# Patient Record
Sex: Male | Born: 1995
Health system: Southern US, Community
[De-identification: ages and names within clinical notes are randomized; demographics above are authoritative.]

## PROBLEM LIST (undated history)

## (undated) HISTORY — PX: OTHER SURGICAL HISTORY: SHX169

---

## 2019-10-12 ENCOUNTER — Emergency Department (HOSPITAL_COMMUNITY)
Admission: EM | Admit: 2019-10-12 | Discharge: 2019-10-12 | Disposition: A | Discharge status: Home / Self Care | Payer: Self-pay | Attending: Emergency Medicine | Admitting: Emergency Medicine

## 2019-10-12 ENCOUNTER — Encounter (HOSPITAL_COMMUNITY): Payer: Self-pay

## 2019-10-12 ENCOUNTER — Other Ambulatory Visit: Payer: Self-pay

## 2019-10-12 DIAGNOSIS — N611 Abscess of the breast and nipple: Secondary | ICD-10-CM

## 2019-10-12 MED ORDER — LIDOCAINE-EPINEPHRINE (PF) 2 %-1:200000 IJ SOLN
10.0000 mL | Freq: Once | INTRAMUSCULAR | Status: AC
  Administered 2019-10-12: 10 mL
  Filled 2019-10-12: qty 10

## 2019-10-12 MED ORDER — DOXYCYCLINE HYCLATE 100 MG PO TABS
100.0000 mg | ORAL_TABLET | Freq: Once | ORAL | Status: AC
  Administered 2019-10-12: 100 mg via ORAL
  Filled 2019-10-12: qty 1

## 2019-10-12 MED ORDER — TRAMADOL HCL 50 MG PO TABS: 50.0000 mg | ORAL_TABLET | Freq: Four times a day (QID) | ORAL | 0 refills | Status: DC | PRN

## 2019-10-12 MED ORDER — DOXYCYCLINE HYCLATE 100 MG PO CAPS: 100.0000 mg | ORAL_CAPSULE | Freq: Two times a day (BID) | ORAL | 0 refills | Status: DC

## 2019-10-12 MED ORDER — TRAMADOL HCL 50 MG PO TABS
100.0000 mg | ORAL_TABLET | Freq: Once | ORAL | Status: AC
  Administered 2019-10-12: 100 mg via ORAL
  Filled 2019-10-12: qty 2

## 2019-10-12 NOTE — Discharge Instructions (Addendum)
You were seen in the emergency department for a breast abscess.  The area was incised and drained and you will need to continue to finish your antibiotics.  Remove the packing in 2 days.  You should contact the breast center for follow-up.  Please return to the emergency department if any worsening symptoms.

## 2019-10-12 NOTE — ED Notes (Signed)
Called to General Surgery x 2.

## 2019-10-12 NOTE — ED Triage Notes (Signed)
Pt presents with c/o possible cyst on right breat. Pt reports he does have a piercing and the area of irritation is close to that.

## 2019-10-12 NOTE — ED Provider Notes (Signed)
[facility] DEPT Provider Note   CSN: [id number] Arrival date & time: 10/12/19  1521     History   Chief Complaint No chief complaint on file. breast abscess  HPI Bryan Booth is a 23 y.o. male.  He is complaining of a cyst or an abscess on right breast for the last few days.  He had a piercing of that nipple.  No fevers or chills.  Moderate pain.  No drainage.  Denies being on any hormone treatment.     The history is provided by the patient.  Abscess Location:  Torso Torso abscess location:  R breast Size:  6 Abscess quality: fluctuance, induration, painful, redness and warmth   Progression:  Worsening Pain details:    Quality:  Throbbing   Severity:  Moderate   Timing:  Constant   Progression:  Worsening Chronicity:  New Relieved by:  None tried Worsened by:  Nothing Ineffective treatments:  None tried Associated symptoms: no fever, no headaches, no nausea and no vomiting     History reviewed. No pertinent past medical history.  There are no active problems to display for this patient.   History reviewed. No pertinent surgical history.      Home Medications    Prior to Admission medications   Not on File    Family History No family history on file.  Social History Social History   Tobacco Use  . Smoking status: Never Smoker  . Smokeless tobacco: Never Used  Substance Use Topics  . Alcohol use: Never    Frequency: Never  . Drug use: Never     Allergies   Penicillins   Review of Systems Review of Systems  Constitutional: Negative for fever.  HENT: Negative for sore throat.   Eyes: Negative for visual disturbance.  Respiratory: Negative for shortness of breath.   Cardiovascular: Negative for chest pain.  Gastrointestinal: Negative for abdominal pain, nausea and vomiting.  Genitourinary: Negative for dysuria.  Musculoskeletal: Negative for neck pain.  Skin: Positive for rash and wound.   Neurological: Negative for headaches.     Physical Exam Updated Vital Signs BP (!) 186/110   Pulse 71   Temp 98.2 F (36.8 C) (Oral)   Resp 16   SpO2 100%   Physical Exam Vitals signs and nursing note reviewed.  Constitutional:      Appearance: He is well-developed.  HENT:     Head: Normocephalic and atraumatic.  Eyes:     Conjunctiva/sclera: Conjunctivae normal.  Neck:     Musculoskeletal: Neck supple.  Cardiovascular:     Rate and Rhythm: Normal rate and regular rhythm.     Pulses: Normal pulses.  Pulmonary:     Effort: Pulmonary effort is normal.  Chest:    Abdominal:     Tenderness: There is no abdominal tenderness. There is no guarding.  Skin:    General: Skin is warm and dry.  Neurological:     Mental Status: He is alert.     GCS: GCS eye subscore is 4. GCS verbal subscore is 5. GCS motor subscore is 6.        ED Treatments / Results  Labs (all labs ordered are listed, but only abnormal results are displayed) Labs Reviewed - No data to display  EKG None  Radiology No results found.  Procedures .Marland KitchenIncision and Drainage  Date/Time: 10/12/2019 9:09 PM Performed by: Hayden Rasmussen, MD Authorized by: Hayden Rasmussen, MD   Consent:    Consent  obtained:  Verbal   Consent given by:  Patient   Risks discussed:  Bleeding, incomplete drainage, pain and infection   Alternatives discussed:  No treatment, delayed treatment and referral Location:    Type:  Abscess   Size:  4   Location:  Trunk   Trunk location:  R breast Pre-procedure details:    Skin preparation:  Betadine Anesthesia (see MAR for exact dosages):    Anesthesia method:  Local infiltration   Local anesthetic:  Lidocaine 2% WITH epi Procedure type:    Complexity:  Simple Procedure details:    Incision types:  Single straight   Scalpel blade:  15   Wound management:  Probed and deloculated   Drainage:  Purulent   Drainage amount:  Moderate   Packing materials:  1/2 in  iodoform gauze   Amount 1/2" iodoform:  10 Post-procedure details:    Patient tolerance of procedure:  Tolerated well, no immediate complications   (including critical care time)  Medications Ordered in ED Medications - No data to display   Initial Impression / Assessment and Plan / ED Course  I have reviewed the triage vital signs and the nursing notes.  Pertinent labs & imaging results that were available during my care of the patient were reviewed by me and considered in my medical decision making (see chart for details).  Clinical Course as of Oct 11 2108  Sat Oct 12, 2019  3468 23 year old male here with right breast abscess.  Also has a piercing in place.  I reviewed the case with Dr. Carolynne Edouard from general surgery who recommended I&D and get if it was superficial or referring on to the breast center.  The patient is agreeable to I&D locally here.   [MB]    Clinical Course User Index [MB] Terrilee Files, MD        Final Clinical Impressions(s) / ED Diagnoses   Final diagnoses:  Breast abscess    ED Discharge Orders         Ordered    traMADol (ULTRAM) 50 MG tablet  Every 6 hours PRN     10/12/19 2118    doxycycline (VIBRAMYCIN) 100 MG capsule  2 times daily     10/12/19 2118           Terrilee Files, MD 10/13/19 [phone]

## 2019-10-15 LAB — AEROBIC CULTURE W GRAM STAIN (SUPERFICIAL SPECIMEN)

## 2019-10-16 ENCOUNTER — Telehealth: Payer: Self-pay

## 2019-10-16 NOTE — Progress Notes (Signed)
ED Antimicrobial Stewardship Positive Culture Follow Up   Bryan Booth is an 23 y.o. male who presented to [facility] on 10/12/2019 with a chief complaint of R breast abscess  Recent Results (from the past 720 hour(s))  Wound or Superficial Culture     Status: None   Collection Time: 10/12/19  9:23 PM   Specimen: Wound  Result Value Ref Range Status   Specimen Description   Final    WOUND Performed at [facility] [address] [place], [place] [address]    Special Requests   Final    RIGHT BREAST Performed at [facility] [address] [place], [place] [address]    Gram Stain   Final    ABUNDANT WBC PRESENT, PREDOMINANTLY PMN ABUNDANT GRAM POSITIVE COCCI Performed at [facility], [place] [address], [place],  [address]    Culture ABUNDANT STREPTOCOCCUS INTERMEDIUS  Final   Report Status 10/15/2019 FINAL  Final   Organism ID, Bacteria STREPTOCOCCUS INTERMEDIUS  Final      Susceptibility   Streptococcus intermedius - MIC*    PENICILLIN <=0.06 SENSITIVE Sensitive     CEFTRIAXONE 0.25 SENSITIVE Sensitive     ERYTHROMYCIN >=8 RESISTANT Resistant     LEVOFLOXACIN 0.5 SENSITIVE Sensitive     VANCOMYCIN 0.5 SENSITIVE Sensitive     * ABUNDANT STREPTOCOCCUS INTERMEDIUS    [x]  Treated with doxycycline, organism resistant to prescribed antimicrobial []  Patient discharged originally without antimicrobial agent and treatment is now indicated  Plan: ask pt if pain and swelling significantly improved If yes: dc doxycycline, no abx If no: dc doxycline, start Cefdinir 300 mg po BID x 5 days   ED Provider: Dr. Delanna Ahmadi T 10/16/2019, 8:36 AM Clinical Pharmacist [phone]

## 2019-10-16 NOTE — Telephone Encounter (Signed)
Post ED Visit - Positive Culture Follow-up: Unsuccessful Patient Follow-up  Culture assessed and recommendations reviewed by:  []  Elenor Quinones, Pharm.D. []  Heide Guile, Pharm.D., BCPS AQ-ID []  Parks Neptune, Pharm.D., BCPS []  Alycia Rossetti, Pharm.D., BCPS []  [place], [place].D., BCPS, AAHIVP []  Legrand Como, Pharm.D., BCPS, AAHIVP []  Wynell Balloon, PharmD []  Vincenza Hews, PharmD, BCPS Colin Rhein Pharm D Positive aerobic culture  For Symptom check  May need a new abx of Cefdinir 300 mg PO BID x 5 days   Dr Melina Copa []  Patient discharged without antimicrobial prescription and treatment is now indicated [x]  Organism is resistant to prescribed ED discharge antimicrobial []  Patient with positive blood cultures   Unable to contact patient after 3 attempts, letter will be sent to address on file  Genia Del 10/16/2019, 2:55 PM

## 2019-11-04 ENCOUNTER — Encounter (HOSPITAL_COMMUNITY): Payer: Self-pay

## 2019-11-04 ENCOUNTER — Other Ambulatory Visit: Payer: Self-pay

## 2019-11-04 ENCOUNTER — Emergency Department (HOSPITAL_COMMUNITY): Payer: Self-pay

## 2019-11-04 DIAGNOSIS — B20 Human immunodeficiency virus [HIV] disease: Principal | ICD-10-CM | POA: Diagnosis present

## 2019-11-04 DIAGNOSIS — A4189 Other specified sepsis: Secondary | ICD-10-CM | POA: Diagnosis present

## 2019-11-04 DIAGNOSIS — R7401 Elevation of levels of liver transaminase levels: Secondary | ICD-10-CM | POA: Diagnosis present

## 2019-11-04 DIAGNOSIS — Z20828 Contact with and (suspected) exposure to other viral communicable diseases: Secondary | ICD-10-CM | POA: Diagnosis present

## 2019-11-04 DIAGNOSIS — I32 Pericarditis in diseases classified elsewhere: Secondary | ICD-10-CM | POA: Diagnosis present

## 2019-11-04 LAB — TROPONIN I (HIGH SENSITIVITY): Troponin I (High Sensitivity): 20 ng/L — ABNORMAL HIGH (ref <18)

## 2019-11-04 LAB — CBC
HCT: 50.2 % (ref 39.0–52.0)
Hemoglobin: 16.4 g/dL (ref 13.0–17.0)
MCH: 30.9 pg (ref 26.0–34.0)
MCHC: 32.7 g/dL (ref 30.0–36.0)
MCV: 94.7 fL (ref 80.0–100.0)
Platelets: 127 10*3/uL — ABNORMAL LOW (ref 150–400)
RBC: 5.3 MIL/uL (ref 4.22–5.81)
RDW: 12.1 % (ref 11.5–15.5)
WBC: 3.6 10*3/uL — ABNORMAL LOW (ref 4.0–10.5)
nRBC: 0 % (ref 0.0–0.2)

## 2019-11-04 LAB — BASIC METABOLIC PANEL
Anion gap: 10 (ref 5–15)
BUN: 14 mg/dL (ref 6–20)
CO2: 27 mmol/L (ref 22–32)
Calcium: 8.3 mg/dL — ABNORMAL LOW (ref 8.9–10.3)
Chloride: 101 mmol/L (ref 98–111)
Creatinine, Ser: 1.14 mg/dL (ref 0.61–1.24)
GFR calc Af Amer: >60 mL/min (ref >60)
GFR calc non Af Amer: >60 mL/min (ref >60)
Glucose, Bld: 125 mg/dL — ABNORMAL HIGH (ref 70–99)
Potassium: 4 mmol/L (ref 3.5–5.1)
Sodium: 138 mmol/L (ref 135–145)

## 2019-11-04 MED ORDER — SODIUM CHLORIDE 0.9% FLUSH
3.0000 mL | Freq: Once | INTRAVENOUS | Status: AC
  Administered 2019-11-05: 04:00:00 via INTRAVENOUS

## 2019-11-04 NOTE — ED Triage Notes (Signed)
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 States he is worried about pneumonia.

## 2019-11-05 ENCOUNTER — Inpatient Hospital Stay (HOSPITAL_COMMUNITY)
Admission: EM | Admit: 2019-11-05 | Discharge: 2019-11-09 | DRG: 975 | Disposition: A | Discharge status: Home / Self Care | Payer: Self-pay | Attending: Internal Medicine | Admitting: Internal Medicine

## 2019-11-05 ENCOUNTER — Encounter (HOSPITAL_COMMUNITY): Payer: Self-pay

## 2019-11-05 ENCOUNTER — Emergency Department (HOSPITAL_COMMUNITY): Payer: Self-pay

## 2019-11-05 DIAGNOSIS — Z20822 Contact with and (suspected) exposure to covid-19: Secondary | ICD-10-CM

## 2019-11-05 DIAGNOSIS — A419 Sepsis, unspecified organism: Secondary | ICD-10-CM | POA: Diagnosis present

## 2019-11-05 DIAGNOSIS — R7401 Elevation of levels of liver transaminase levels: Secondary | ICD-10-CM | POA: Diagnosis present

## 2019-11-05 DIAGNOSIS — D696 Thrombocytopenia, unspecified: Secondary | ICD-10-CM

## 2019-11-05 DIAGNOSIS — B20 Human immunodeficiency virus [HIV] disease: Secondary | ICD-10-CM | POA: Diagnosis present

## 2019-11-05 DIAGNOSIS — R079 Chest pain, unspecified: Secondary | ICD-10-CM

## 2019-11-05 DIAGNOSIS — I309 Acute pericarditis, unspecified: Secondary | ICD-10-CM | POA: Diagnosis present

## 2019-11-05 DIAGNOSIS — Z21 Asymptomatic human immunodeficiency virus [HIV] infection status: Secondary | ICD-10-CM | POA: Diagnosis present

## 2019-11-05 DIAGNOSIS — I301 Infective pericarditis: Secondary | ICD-10-CM

## 2019-11-05 LAB — BRAIN NATRIURETIC PEPTIDE: B Natriuretic Peptide: 27.4 pg/mL (ref 0.0–100.0)

## 2019-11-05 LAB — HEPATIC FUNCTION PANEL
ALT: 394 U/L — ABNORMAL HIGH (ref 0–44)
AST: 1099 U/L — ABNORMAL HIGH (ref 15–41)
Albumin: 3.5 g/dL (ref 3.5–5.0)
Alkaline Phosphatase: 71 U/L (ref 38–126)
Bilirubin, Direct: 0.7 mg/dL — ABNORMAL HIGH (ref 0.0–0.2)
Indirect Bilirubin: 0.9 mg/dL (ref 0.3–0.9)
Total Bilirubin: 1.6 mg/dL — ABNORMAL HIGH (ref 0.3–1.2)
Total Protein: 6.4 g/dL — ABNORMAL LOW (ref 6.5–8.1)

## 2019-11-05 LAB — D-DIMER, QUANTITATIVE: D-Dimer, Quant: 9.51 ug/mL-FEU — ABNORMAL HIGH (ref 0.00–0.50)

## 2019-11-05 LAB — C-REACTIVE PROTEIN: CRP: 1.6 mg/dL — ABNORMAL HIGH (ref <1.0)

## 2019-11-05 LAB — INFLUENZA PANEL BY PCR (TYPE A & B)
Influenza A By PCR: NEGATIVE
Influenza B By PCR: NEGATIVE

## 2019-11-05 LAB — POC SARS CORONAVIRUS 2 AG -  ED: SARS Coronavirus 2 Ag: NEGATIVE

## 2019-11-05 LAB — FERRITIN: Ferritin: >7500 ng/mL — ABNORMAL HIGH (ref 24–336)

## 2019-11-05 LAB — LACTATE DEHYDROGENASE: LDH: 2249 U/L — ABNORMAL HIGH (ref 98–192)

## 2019-11-05 LAB — TROPONIN I (HIGH SENSITIVITY): Troponin I (High Sensitivity): 28 ng/L — ABNORMAL HIGH (ref <18)

## 2019-11-05 LAB — FIBRINOGEN: Fibrinogen: 248 mg/dL (ref 210–475)

## 2019-11-05 MED ORDER — ACETAMINOPHEN 650 MG RE SUPP: 650.0000 mg | Freq: Four times a day (QID) | RECTAL | Status: DC | PRN

## 2019-11-05 MED ORDER — COLCHICINE 0.6 MG PO TABS
1.2000 mg | ORAL_TABLET | Freq: Once | ORAL | Status: AC
  Administered 2019-11-05: 1.2 mg via ORAL
  Filled 2019-11-05: qty 2

## 2019-11-05 MED ORDER — KETOROLAC TROMETHAMINE 30 MG/ML IJ SOLN: 30.0000 mg | Freq: Four times a day (QID) | INTRAMUSCULAR | Status: DC | PRN

## 2019-11-05 MED ORDER — COLCHICINE 0.6 MG PO TABS
0.6000 mg | ORAL_TABLET | Freq: Two times a day (BID) | ORAL | Status: DC
  Administered 2019-11-05 – 2019-11-09 (×8): 0.6 mg via ORAL
  Filled 2019-11-05 (×8): qty 1

## 2019-11-05 MED ORDER — KETOROLAC TROMETHAMINE 30 MG/ML IJ SOLN
30.0000 mg | Freq: Once | INTRAMUSCULAR | Status: AC
  Administered 2019-11-05: 30 mg via INTRAVENOUS
  Filled 2019-11-05: qty 1

## 2019-11-05 MED ORDER — ENOXAPARIN SODIUM 40 MG/0.4ML ~~LOC~~ SOLN
40.0000 mg | SUBCUTANEOUS | Status: DC
  Administered 2019-11-05 – 2019-11-08 (×4): 40 mg via SUBCUTANEOUS
  Filled 2019-11-05 (×4): qty 0.4

## 2019-11-05 MED ORDER — IBUPROFEN 200 MG PO TABS
600.0000 mg | ORAL_TABLET | Freq: Once | ORAL | Status: AC
  Administered 2019-11-05: 600 mg via ORAL
  Filled 2019-11-05: qty 3

## 2019-11-05 MED ORDER — ONDANSETRON HCL 4 MG PO TABS: 4.0000 mg | ORAL_TABLET | Freq: Four times a day (QID) | ORAL | Status: DC | PRN

## 2019-11-05 MED ORDER — ZOLPIDEM TARTRATE 5 MG PO TABS: 5.0000 mg | ORAL_TABLET | Freq: Every evening | ORAL | Status: DC | PRN

## 2019-11-05 MED ORDER — ONDANSETRON HCL 4 MG/2ML IJ SOLN: 4.0000 mg | Freq: Four times a day (QID) | INTRAMUSCULAR | Status: DC | PRN

## 2019-11-05 MED ORDER — DOCUSATE SODIUM 100 MG PO CAPS
100.0000 mg | ORAL_CAPSULE | Freq: Two times a day (BID) | ORAL | Status: DC
  Administered 2019-11-05 – 2019-11-09 (×8): 100 mg via ORAL
  Filled 2019-11-05 (×8): qty 1

## 2019-11-05 MED ORDER — IOHEXOL 350 MG/ML SOLN
100.0000 mL | Freq: Once | INTRAVENOUS | Status: AC | PRN
  Administered 2019-11-05: 100 mL via INTRAVENOUS

## 2019-11-05 MED ORDER — BISACODYL 10 MG RE SUPP: 10.0000 mg | Freq: Every day | RECTAL | Status: DC | PRN

## 2019-11-05 MED ORDER — GUAIFENESIN ER 600 MG PO TB12
600.0000 mg | ORAL_TABLET | Freq: Two times a day (BID) | ORAL | Status: DC
  Administered 2019-11-05 – 2019-11-09 (×8): 600 mg via ORAL
  Filled 2019-11-05 (×8): qty 1

## 2019-11-05 MED ORDER — POLYETHYLENE GLYCOL 3350 17 G PO PACK: 17.0000 g | PACK | Freq: Every day | ORAL | Status: DC | PRN

## 2019-11-05 MED ORDER — SODIUM CHLORIDE (PF) 0.9 % IJ SOLN
INTRAMUSCULAR | Status: AC
  Administered 2019-11-05: 04:00:00 via INTRAVENOUS
  Filled 2019-11-05: qty 50

## 2019-11-05 MED ORDER — ACETAMINOPHEN 325 MG PO TABS
650.0000 mg | ORAL_TABLET | Freq: Once | ORAL | Status: AC
  Administered 2019-11-05: 650 mg via ORAL

## 2019-11-05 MED ORDER — TRAMADOL HCL 50 MG PO TABS: 50.0000 mg | ORAL_TABLET | Freq: Four times a day (QID) | ORAL | Status: DC | PRN

## 2019-11-05 MED ORDER — ACETAMINOPHEN 325 MG PO TABS
650.0000 mg | ORAL_TABLET | Freq: Four times a day (QID) | ORAL | Status: DC | PRN
  Administered 2019-11-06: 650 mg via ORAL
  Filled 2019-11-05: qty 2

## 2019-11-05 NOTE — ED Notes (Signed)
Mother would like a call as soon as possible with an update, Adoni Greenough, [phone] from the doctor.

## 2019-11-05 NOTE — ED Notes (Signed)
With ambulation, HR was 125 and pulse oximetry dropped to 90%. Patient denies SOB but does endorse lightheadedness. Patient assisted back to bed. SpO2 94% on RA with deep breathing after ambulation.

## 2019-11-05 NOTE — ED Notes (Signed)
2130 bad vitals validated; RR of patient is not 0, it is 18

## 2019-11-05 NOTE — ED Provider Notes (Addendum)
[facility]-EMERGENCY DEPT Provider Note   CSN: [id number] Arrival date & time: 11/04/19  2042     History   Chief Complaint Chief Complaint  Patient presents with   Chest Pain    HPI Bryan Booth is a 23 y.o. male with a hx of no major medical problems presents to the Emergency Department complaining of gradual, persistent, progressively worsening URI symptoms onset 5 days ago. Pt reports chest pain is central, nonradiating and tight feeling.   Laying down improves pain, sitting up makes pain worse. Associated symptoms include myalgias, nausea, chest pain, SOB, dizziness.  Pt denies fever, cough sore throat, loss of taste/smell.  Pt denies known sick contacts or COVID contacts.  No pleuritic pain.  Pt reports taking Nyquil and tylenol with minimal relief. Pt denies neck pain, neck stiffness, abd pain, V/D, weakness, syncope. Pt denies heart problems.  Pt reports he has mostly been laying in bed over the last few days.  He denies hx of DVT, Lupus, cancer.  Denies hormone supplements.  Pt denies leg pain/swelling.      The history is provided by the patient and medical records. No language interpreter was used.    History reviewed. No pertinent past medical history.  Patient Active Problem List   Diagnosis Date Noted   Pericarditis 11/05/2019    History reviewed. No pertinent surgical history.      Home Medications    Prior to Admission medications   Medication Sig Start Date End Date Taking? Authorizing Provider  acetaminophen (TYLENOL) 500 MG tablet Take 1,000 mg by mouth every 6 (six) hours as needed for mild pain.   Yes [provider]  doxycycline (VIBRAMYCIN) 100 MG capsule Take 1 capsule (100 mg total) by mouth 2 (two) times daily. Patient not taking: Reported on 11/05/2019 10/12/19   Terrilee FilesButler, Michael C, MD  traMADol (ULTRAM) 50 MG tablet Take 1 tablet (50 mg total) by mouth every 6 (six) hours as needed. Patient not taking:  Reported on 11/05/2019 10/12/19   Terrilee FilesButler, Michael C, MD    Family History No family history on file.  Social History Social History   Tobacco Use   Smoking status: Never Smoker   Smokeless tobacco: Never Used  Substance Use Topics   Alcohol use: Never    Frequency: Never   Drug use: Never     Allergies   Peanut-containing drug products and Penicillins   Review of Systems Review of Systems  Constitutional: Negative for appetite change, diaphoresis, fatigue, fever and unexpected weight change.  HENT: Negative for mouth sores.   Eyes: Negative for visual disturbance.  Respiratory: Positive for shortness of breath. Negative for cough, chest tightness and wheezing.   Cardiovascular: Positive for chest pain. Negative for leg swelling.  Gastrointestinal: Positive for nausea. Negative for abdominal pain, constipation, diarrhea and vomiting.  Endocrine: Negative for polydipsia, polyphagia and polyuria.  Genitourinary: Negative for dysuria, frequency, hematuria and urgency.  Musculoskeletal: Negative for back pain and neck stiffness.  Skin: Negative for rash.  Allergic/Immunologic: Negative for immunocompromised state.  Neurological: Positive for dizziness and light-headedness. Negative for syncope and headaches.  Hematological: Does not bruise/bleed easily.  Psychiatric/Behavioral: The patient is not nervous/anxious.      Physical Exam Updated Vital Signs BP 127/86    Pulse 91    Temp (!) 97.3 F (36.3 C) (Oral)    Resp 17    SpO2 98%   Physical Exam Vitals signs and nursing note reviewed.  Constitutional:  General: He is not in acute distress.    Appearance: He is not diaphoretic.  HENT:     Head: Normocephalic.  Eyes:     General: No scleral icterus.    Conjunctiva/sclera: Conjunctivae normal.  Neck:     Musculoskeletal: Normal range of motion.     Comments: No JVD Cardiovascular:     Rate and Rhythm: Normal rate and regular rhythm.     Pulses: Normal  pulses.          Radial pulses are 2+ on the right side and 2+ on the left side.     Heart sounds: No murmur.  Pulmonary:     Effort: Pulmonary effort is normal. No tachypnea, accessory muscle usage, prolonged expiration, respiratory distress or retractions.     Breath sounds: Normal breath sounds. No stridor. No decreased breath sounds, wheezing or rhonchi.     Comments: Equal chest rise. No increased work of breathing. Abdominal:     General: There is no distension.     Palpations: Abdomen is soft.     Tenderness: There is no abdominal tenderness. There is no guarding or rebound.  Musculoskeletal:     Comments: Moves all extremities equally and without difficulty.  Skin:    General: Skin is warm and dry.     Capillary Refill: Capillary refill takes less than 2 seconds.     Comments: Hot to touch  Neurological:     Mental Status: He is alert.     GCS: GCS eye subscore is 4. GCS verbal subscore is 5. GCS motor subscore is 6.     Comments: Speech is clear and goal oriented.  Psychiatric:        Mood and Affect: Mood normal.      ED Treatments / Results  Labs (all labs ordered are listed, but only abnormal results are displayed) Labs Reviewed  BASIC METABOLIC PANEL - Abnormal; Notable for the following components:      Result Value   Glucose, Bld 125 (*)    Calcium 8.3 (*)    All other components within normal limits  CBC - Abnormal; Notable for the following components:   WBC 3.6 (*)    Platelets 127 (*)    All other components within normal limits  D-DIMER, QUANTITATIVE (NOT AT [facility]) - Abnormal; Notable for the following components:   D-Dimer, Quant 9.51 (*)    All other components within normal limits  TROPONIN I (HIGH SENSITIVITY) - Abnormal; Notable for the following components:   Troponin I (High Sensitivity) 20 (*)    All other components within normal limits  TROPONIN I (HIGH SENSITIVITY) - Abnormal; Notable for the following components:   Troponin I (High  Sensitivity) 28 (*)    All other components within normal limits  NOVEL CORONAVIRUS, NAA (HOSP ORDER, SEND-OUT TO REF LAB; TAT 18-24 HRS)  INFLUENZA PANEL BY PCR (TYPE A & B)  POC SARS CORONAVIRUS 2 AG -  ED    EKG EKG Interpretation  Date/Time:  Monday November 04 2019 21:52:30 EST Ventricular Rate:  92 PR Interval:    QRS Duration: 95 QT Interval:  323 QTC Calculation: 400 R Axis:   102 Text Interpretation: Sinus rhythm Borderline right axis deviation ST elevation suggests acute pericarditis Baseline wander in lead(s) V6 No old tracing to compare Confirmed by Bryan Booth, Bryan Booth [phone]) on 11/05/2019 1:06:24 AM   Radiology Dg Chest 2 View  Result Date: 11/04/2019 CLINICAL DATA:  Chest pain and shortness of breath EXAM:  CHEST - 2 VIEW COMPARISON:  None. FINDINGS: The heart size and mediastinal contours are within normal limits. Both lungs are clear. The visualized skeletal structures are unremarkable. IMPRESSION: No active cardiopulmonary disease. Electronically Signed   By: Jasmine Pang M.D.   On: 11/04/2019 22:05   Ct Angio Chest Pe W And/or Wo Contrast  Result Date: 11/05/2019 CLINICAL DATA:  Chest pain and body aches. EXAM: CT ANGIOGRAPHY CHEST WITH CONTRAST TECHNIQUE: Multidetector CT imaging of the chest was performed using the standard protocol during bolus administration of intravenous contrast. Multiplanar CT image reconstructions and MIPs were obtained to evaluate the vascular anatomy. CONTRAST:  OMNIPAQUE IOHEXOL 350 MG/ML SOLN COMPARISON:  Chest x-ray from yesterday FINDINGS: Cardiovascular: Generous heart size likely from low volumes as there was normal appearing heart size on preceding cxr. No pericardial effusion. Accounting for intermittent motion artifact there is no evidence of pulmonary embolism. Negative aorta. Mediastinum/Nodes: No adenopathy or pneumomediastinum Lungs/Pleura: There is no edema, consolidation, effusion, or pneumothorax. Upper Abdomen: No acute  finding Musculoskeletal: Gynecomastia with nipple rings. No osseous findings. Review of the MIP images confirms the above findings. IMPRESSION: 1. Negative for pulmonary embolism or other acute finding. 2. Mild motion degradation. Electronically Signed   By: Marnee Spring M.D.   On: 11/05/2019 04:29    Procedures .Critical Care Performed by: Dierdre Forth, PA-C Authorized by: Dierdre Forth, PA-C   Critical care provider statement:    Critical care time (minutes):  45   Critical care was necessary to treat or prevent imminent or life-threatening deterioration of the following conditions:  Cardiac failure and circulatory failure   Critical care was time spent personally by me on the following activities:  Discussions with consultants, evaluation of patient's response to treatment, examination of patient, ordering and performing treatments and interventions, ordering and review of laboratory studies, ordering and review of radiographic studies, pulse oximetry, re-evaluation of patient's condition, obtaining history from patient or surrogate and review of old charts   I assumed direction of critical care for this patient from another provider in my specialty: no     (including critical care time)  Medications Ordered in ED Medications  sodium chloride flush (NS) 0.9 % injection 3 mL ( Intravenous Given by Other 11/05/19 0403)  acetaminophen (TYLENOL) tablet 650 mg (650 mg Oral Given 11/05/19 0153)  ibuprofen (ADVIL) tablet 600 mg (600 mg Oral Given 11/05/19 0433)  iohexol (OMNIPAQUE) 350 MG/ML injection 100 mL (100 mLs Intravenous Contrast Given 11/05/19 0403)  ketorolac (TORADOL) 30 MG/ML injection 30 mg (30 mg Intravenous Given 11/05/19 0526)  colchicine tablet 1.2 mg (1.2 mg Oral Given 11/05/19 0526)     Initial Impression / Assessment and Plan / ED Course  I have reviewed the triage vital signs and the nursing notes.  Pertinent labs & imaging results that were available  during my care of the patient were reviewed by me and considered in my medical decision making (see chart for details).  Clinical Course as of Nov 04 549  Tue Nov 05, 2019  9147 [phone] - mother Bryan Booth   [HM]  6578 No evidence of cardiomegaly or widened mediastinum.  No pneumonia, pulmonary edema or pneumothorax.  DG Chest 2 View [HM]  0125 Febrile.  Tylenol ordered  Temp(!): 103.3 F (39.6 C) [HM]  0333 Persistently febrile. Will give ibuprofen.  Temp(!): 102.1 F (38.9 C) [HM]  4696 Discussed with Dr. Deforest Hoyles of cardiology.  He recommends hospitalist admission for observation and pain control.     [  HM]  [phone] Patient requested that I contact his mother to give her an update.  The number he has provided for her is not in service.   [HM]  [phone] Discussed with Bryan Booth who will admit.   [HM]    Clinical Course User Index [HM] Maggie Senseney, Dahlia Client, PA-C        Kinsley Nicklaus was evaluated in Emergency Department on 11/05/2019 for the symptoms described in the history of present illness. He was evaluated in the context of the global COVID-19 pandemic, which necessitated consideration that the patient might be at risk for infection with the SARS-CoV-2 virus that causes COVID-19. Institutional protocols and algorithms that pertain to the evaluation of patients at risk for COVID-19 are in a state of rapid change based on information released by regulatory bodies including the CDC and federal and state organizations. These policies and algorithms were followed during the patient's care in the ED.   Patient presents with viral illness and worsening chest pain.  Concern for pericarditis versus early myocarditis. Patient is low risk for pulmonary embolism and pain is not pleuritic.   Will obtain D-dimer.  EKG with likely pericarditis.  Patient is young and otherwise healthy.  Doubt acute coronary syndrome as reason for his slightly elevated troponin.  02:00AM Significantly elevated  D-dimer.  Increasing troponin.  Rapid Covid is negative however I suspect this is the cause of patient's symptoms.  05:15 AM CT angio without evidence of pulmonary embolism or pericardial effusion.  Patient continues to have severe pain and fever despite treatment with acetaminophen and ibuprofen.  Discussed with cardiology who recommends admission.  Patient started on Toradol and colchicine.  5:44 AM Patient continues to be ill-appearing.  With ambulation from the bed to the door of the room his heart rate increases to 125 and his oxygen saturations dipped to 90%.    Final Clinical Impressions(s) / ED Diagnoses   Final diagnoses:  Suspected COVID-19 virus infection  Central chest pain  Acute viral pericarditis    ED Discharge Orders    None         Mardene Sayer Boyd Kerbs 11/05/19 7741    Bryan Booth, Layla Maw, DO 11/05/19 0645    Lakendria Nicastro, Dahlia Client, PA-C 11/05/19 0645    Bryan Booth, Layla Maw, DO 11/05/19 [phone]

## 2019-11-05 NOTE — ED Notes (Signed)
EDPA made aware of temperature. Patient provided oral hydration.

## 2019-11-05 NOTE — ED Notes (Signed)
Patient transported to CT 

## 2019-11-05 NOTE — H&P (Addendum)
[facility] History and Physical  Bryan Booth GLO:[id number] DOB: Jan 05, 1996 DOA: 11/05/2019  Referring physician: ED  PCP: Patient, No Pcp Per   Chief Complaint: Chest pain  HPI: Bryan Booth is a 23 y.o. male with no significant past medical history presented to hospital with complaints of chest pain.  Patient stated that he has been working in the cold and has been having chest pain, body aches and some nausea for the last 1 week.  Patient states that he has been having pain which is sharp in nature especially on coughing and taking a deep breath.  Denies any fever chills or rigor but was noted to have fever in the ED.Marland Kitchen  Denies any sputum production.  Denies syncope.  Patient denies any recent travel or sick contacts.  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  Patient denies any leg swelling tenderness.  Denies previous history of chest pain or cardiac conditions.  No history of  Vomiting, abdominal pain or diarrhea..  No urinary urgency, frequency or dysuria.  ED Course: In the ED, patient was mildly tachycardic and pulse ox dropped to 90% on ambulation.    Oxygen saturation improved to 94% on room air with deep breathing. He was febrile in the ED with a T-max of 103.3 F with mild leukopenia.  He did have mild thrombocytopenia as well..  EKG done in the ED showed some diffuse ST segment elevation in multiple leads.  Chest x-ray was unremarkable.  CT angiogram of the chest did not show any evidence of pneumonia or pulmonary embolism.  Hospitalist team was then consulted for observation in the hospital.  Review of Systems:  All systems were reviewed and were negative unless otherwise mentioned in the HPI  History reviewed. No pertinent past medical history. Past Surgical History:  Procedure Laterality Date  . non distended testis      Social History:  reports that he has never smoked. He has never used smokeless tobacco. He reports that he does not drink  alcohol or use drugs.  Allergies  Allergen Reactions  . Peanut-Containing Drug Products Anaphylaxis  . Penicillins Other (See Comments)    Did it involve swelling of the face/tongue/throat, SOB, or low BP? Unknown Did it involve sudden or severe rash/hives, skin peeling, or any reaction on the inside of your mouth or nose? Unknown Did you need to seek medical attention at a hospital or doctor's office? Unknown When did it last happen?childhood If all above answers are "NO", may proceed with cephalosporin use.      Family History  Problem Relation Age of Onset  . Mitral valve prolapse Mother   . CAD Mother   . Hypertension Mother      Prior to Admission medications   Medication Sig Start Date End Date Taking? Authorizing Provider  acetaminophen (TYLENOL) 500 MG tablet Take 1,000 mg by mouth every 6 (six) hours as needed for mild pain.   Yes [provider]  doxycycline (VIBRAMYCIN) 100 MG capsule Take 1 capsule (100 mg total) by mouth 2 (two) times daily. Patient not taking: Reported on 11/05/2019 10/12/19   Terrilee Files, MD  traMADol (ULTRAM) 50 MG tablet Take 1 tablet (50 mg total) by mouth every 6 (six) hours as needed. Patient not taking: Reported on 11/05/2019 10/12/19   Terrilee Files, MD    Physical Exam: Vitals:   11/05/19 0500 11/05/19 0528 11/05/19 0656 11/05/19 0658  BP: 121/81  119/78   Pulse: 76  71   Resp: (!) 21  20   Temp:  (!) 102.4 F (39.1 C)  98.2 F (36.8 C)  TempSrc:  Oral  Oral  SpO2: 94%  94%   Weight:      Height:       Wt Readings from Last 3 Encounters:  11/05/19 95.3 kg   Body mass index is 33.89 kg/m.  General: Obese.  Not in obvious distress.  On room air, HENT: Normocephalic, pupils equally reacting to light and accommodation.  No scleral pallor or icterus noted. Oral mucosa is moist.  Chest: Diminished breath sounds bilaterally.  Gynecomastia with nipple rings CVS: S1 &S2 heard. No murmur.  Regular rate and  rhythm. Abdomen: Soft, nontender, nondistended.  Bowel sounds are heard.  Liver is not palpable, no abdominal mass palpated Extremities: No cyanosis, clubbing or edema.  Peripheral pulses are palpable. Psych: Alert, awake and oriented, normal mood CNS:  No cranial nerve deficits.  Power equal in all extremities.   No cerebellar signs.   Skin: Warm and dry.  No rashes noted.  Tattoos noted  Labs on Admission:   CBC: Recent Labs  Lab 11/04/19 2203  WBC 3.6*  HGB 16.4  HCT 50.2  MCV 94.7  PLT 127*    Basic Metabolic Panel: Recent Labs  Lab 11/04/19 2203  NA 138  K 4.0  CL 101  CO2 27  GLUCOSE 125*  BUN 14  CREATININE 1.14  CALCIUM 8.3*    Liver Function Tests: No results for input(s): AST, ALT, ALKPHOS, BILITOT, PROT, ALBUMIN in the last 168 hours. No results for input(s): LIPASE, AMYLASE in the last 168 hours. No results for input(s): AMMONIA in the last 168 hours.  Cardiac Enzymes: No results for input(s): CKTOTAL, CKMB, CKMBINDEX, TROPONINI in the last 168 hours.  BNP (last 3 results) No results for input(s): BNP in the last 8760 hours.  ProBNP (last 3 results) No results for input(s): PROBNP in the last 8760 hours.  CBG: No results for input(s): GLUCAP in the last 168 hours.  Lipase  No results found for: LIPASE   Urinalysis No results found for: COLORURINE, APPEARANCEUR, LABSPEC, PHURINE, GLUCOSEU, HGBUR, BILIRUBINUR, KETONESUR, PROTEINUR, UROBILINOGEN, NITRITE, LEUKOCYTESUR   Drugs of Abuse  No results found for: LABOPIA, COCAINSCRNUR, Alexis, AMPHETMU, THCU, LABBARB    Radiological Exams on Admission: Dg Chest 2 View  Result Date: 11/04/2019 CLINICAL DATA:  Chest pain and shortness of breath EXAM: CHEST - 2 VIEW COMPARISON:  None. FINDINGS: The heart size and mediastinal contours are within normal limits. Both lungs are clear. The visualized skeletal structures are unremarkable. IMPRESSION: No active cardiopulmonary disease. Electronically Signed    By: Donavan Foil M.D.   On: 11/04/2019 22:05   Ct Angio Chest Pe W And/or Wo Contrast  Result Date: 11/05/2019 CLINICAL DATA:  Chest pain and body aches. EXAM: CT ANGIOGRAPHY CHEST WITH CONTRAST TECHNIQUE: Multidetector CT imaging of the chest was performed using the standard protocol during bolus administration of intravenous contrast. Multiplanar CT image reconstructions and MIPs were obtained to evaluate the vascular anatomy. CONTRAST:  112mL OMNIPAQUE IOHEXOL 350 MG/ML SOLN COMPARISON:  Chest x-ray from yesterday FINDINGS: Cardiovascular: Generous heart size likely from low volumes as there was normal appearing heart size on preceding cxr. No pericardial effusion. Accounting for intermittent motion artifact there is no evidence of pulmonary embolism. Negative aorta. Mediastinum/Nodes: No adenopathy or pneumomediastinum Lungs/Pleura: There is no edema, consolidation, effusion, or pneumothorax. Upper Abdomen: No acute finding Musculoskeletal: Gynecomastia with nipple  rings. No osseous findings. Review of the MIP images confirms the above findings. IMPRESSION: 1. Negative for pulmonary embolism or other acute finding. 2. Mild motion degradation. Electronically Signed   By: Marnee SpringJonathon  Watts M.D.   On: 11/05/2019 04:29    EKG: Personally reviewed by me which shows features of acute pericarditis with diffuse ST segment elevation, PR segment depression in lead II.  Assessment/Plan Active Problems:   Pericarditis  Myalgia, nausea fever with pleuritic chest pain.  Mild leukopenia.  Likely acute pericarditis, likely viral.  EKG showed ST elevation diffuse.  CRP and D-dimer elevated.  Influenza was negative.  Cardiology was consulted by ED who recommended Toradol and colchicine.  We will continue with that.  Received a loading dose of colchicine 1.2 mg in the ED.  We will continue with 0.5 mg twice daily.  Will closely monitor.  Point-of-care Covid is negative.  PCR pending.  Put on airborne isolation  precautions until PCR is negative.  Check 2D echocardiogram.  Check BNP.  Check HIV.  Check inflammatory biomarkers.  D-dimer was elevated.  Leukopenia, thrombocytopenia.  Will closely monitor.  Could be viral illness related.  Check LFT in a.m. check inflammatory biomarkers.   DVT Prophylaxis: Lovenox  Consultant: ED consulted with cardiology  Code Status: Full code  Microbiology COVID-19 test PCR pending  Antibiotics: None  Family Communication:  Patients' condition and plan of care including tests being ordered have been discussed with the patient and the patient's mother who indicate understanding and agree with the plan.  Disposition Plan: Home  Severity of Illness: The appropriate patient status for this patient is OBSERVATION. Observation status is judged to be reasonable and necessary in order to provide the required intensity of service to ensure the patient's safety. The patient's presenting symptoms, physical exam findings, and initial radiographic and laboratory data in the context of their medical condition is felt to place them at decreased risk for further clinical deterioration. Furthermore, it is anticipated that the patient will be medically stable for discharge from the hospital within 2 midnights of admission. The following factors support the patient status of observation.   Signed, Joycelyn DasLaxman Anaiah Mcmannis, MD [facility] 11/05/2019

## 2019-11-06 ENCOUNTER — Observation Stay (HOSPITAL_BASED_OUTPATIENT_CLINIC_OR_DEPARTMENT_OTHER): Payer: Self-pay

## 2019-11-06 DIAGNOSIS — Z20828 Contact with and (suspected) exposure to other viral communicable diseases: Secondary | ICD-10-CM

## 2019-11-06 DIAGNOSIS — R079 Chest pain, unspecified: Secondary | ICD-10-CM

## 2019-11-06 DIAGNOSIS — I309 Acute pericarditis, unspecified: Secondary | ICD-10-CM | POA: Diagnosis present

## 2019-11-06 DIAGNOSIS — B20 Human immunodeficiency virus [HIV] disease: Principal | ICD-10-CM

## 2019-11-06 DIAGNOSIS — A419 Sepsis, unspecified organism: Secondary | ICD-10-CM

## 2019-11-06 LAB — COMPREHENSIVE METABOLIC PANEL
ALT: 499 U/L — ABNORMAL HIGH (ref 0–44)
AST: 1422 U/L — ABNORMAL HIGH (ref 15–41)
Albumin: 3.6 g/dL (ref 3.5–5.0)
Alkaline Phosphatase: 106 U/L (ref 38–126)
Anion gap: 10 (ref 5–15)
BUN: 17 mg/dL (ref 6–20)
CO2: 25 mmol/L (ref 22–32)
Calcium: 8.6 mg/dL — ABNORMAL LOW (ref 8.9–10.3)
Chloride: 102 mmol/L (ref 98–111)
Creatinine, Ser: 1 mg/dL (ref 0.61–1.24)
GFR calc Af Amer: >60 mL/min (ref >60)
GFR calc non Af Amer: >60 mL/min (ref >60)
Glucose, Bld: 104 mg/dL — ABNORMAL HIGH (ref 70–99)
Potassium: 4.3 mmol/L (ref 3.5–5.1)
Sodium: 137 mmol/L (ref 135–145)
Total Bilirubin: 0.8 mg/dL (ref 0.3–1.2)
Total Protein: 6.8 g/dL (ref 6.5–8.1)

## 2019-11-06 LAB — ECHOCARDIOGRAM COMPLETE
Height: 66 in
Weight: 3365.1 oz

## 2019-11-06 LAB — CBC
HCT: 48.4 % (ref 39.0–52.0)
Hemoglobin: 15.9 g/dL (ref 13.0–17.0)
MCH: 30.5 pg (ref 26.0–34.0)
MCHC: 32.9 g/dL (ref 30.0–36.0)
MCV: 92.9 fL (ref 80.0–100.0)
Platelets: 110 10*3/uL — ABNORMAL LOW (ref 150–400)
RBC: 5.21 MIL/uL (ref 4.22–5.81)
RDW: 12.1 % (ref 11.5–15.5)
WBC: 2.6 10*3/uL — ABNORMAL LOW (ref 4.0–10.5)
nRBC: 0 % (ref 0.0–0.2)

## 2019-11-06 LAB — HEPATITIS PANEL, ACUTE
HCV Ab: NONREACTIVE
Hep A IgM: NONREACTIVE
Hep B C IgM: NONREACTIVE
Hepatitis B Surface Ag: NONREACTIVE

## 2019-11-06 LAB — PROTIME-INR
INR: 1 (ref 0.8–1.2)
Prothrombin Time: 13.5 seconds (ref 11.4–15.2)

## 2019-11-06 LAB — MAGNESIUM: Magnesium: 2.2 mg/dL (ref 1.7–2.4)

## 2019-11-06 LAB — NOVEL CORONAVIRUS, NAA (HOSP ORDER, SEND-OUT TO REF LAB; TAT 18-24 HRS): SARS-CoV-2, NAA: NOT DETECTED

## 2019-11-06 LAB — ACETAMINOPHEN LEVEL: Acetaminophen (Tylenol), Serum: <10 ug/mL — ABNORMAL LOW (ref 10–30)

## 2019-11-06 LAB — HIV ANTIBODY (ROUTINE TESTING W REFLEX): HIV Screen 4th Generation wRfx: REACTIVE — AB

## 2019-11-06 MED ORDER — IBUPROFEN 800 MG PO TABS
800.0000 mg | ORAL_TABLET | Freq: Three times a day (TID) | ORAL | Status: DC
  Administered 2019-11-06 – 2019-11-09 (×9): 800 mg via ORAL
  Filled 2019-11-06 (×9): qty 1

## 2019-11-06 NOTE — TOC Progression Note (Signed)
Transition of Care [facility]) - Progression Note    Patient Details  Name: Bryan Booth MRN: [id number] Date of Birth: 1996/01/07  [facility]) CM/SW Contact  Purcell Mouton, RN Phone Number: 11/06/2019, 10:03 AM  Clinical Narrative:    Will continue to follow for discharge needs.  Expected Discharge Plan: Home/Self Care Barriers to Discharge: No Barriers Identified  Expected Discharge Plan and Services Expected Discharge Plan: Home/Self Care   Discharge Planning Services: CM Consult   Living arrangements for the past 2 months: Single Family Home                                       Social Determinants of Health (SDOH) Interventions    Readmission Risk Interventions No flowsheet data found.

## 2019-11-06 NOTE — Progress Notes (Addendum)
PROGRESS NOTE                                                                                                                                                                                                             Patient Demographics:    Bryan ApleyJustin Vangorden, is a 23 y.o. male, DOB - 02/08/1996, WUJ:[id number]  Admit date - 11/05/2019   Admitting Physician Eduard ClosArshad N Kakrakandy, MD  Outpatient Primary MD for the patient is Patient, No Pcp Per  LOS - 0  Outpatient Specialists: None  Chief Complaint  Patient presents with  . Chest Pain       Brief Narrative 23 year old male with no past medical history presented with substernal chest pain.  Patient reports that he was working out in the cold and started having chest pain, body aches and nausea for almost a week.  Chest pain is sharp and present on coughing or taking deep breaths, no fevers or chills.  Patient however was found to be febrile in the ED with temperature of 103 F.  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 In the ED, patient was septic with fever, tachycardic and dropping O2 sat to 90% on ambulation.  EKG showed diffuse ST segment elevation in multiple leads.  Chest x-ray unremarkable.  CT angiogram of the chest negative for PE or infiltrate. Hospitalist consulted for admission to telemetry.   Subjective:   Patient reports a chest pain to be slightly better but still has some substernal pain on deep inspiration   Assessment  & Plan :   Principal problem Acute pericarditis Suspect viral etiology.  Stable on telemetry.  Cardiology consult appreciated and patient started on scheduled Motrin 810 mg 3 times daily.  Given loading dose of colchicine 1.2 mg and started on colchicine 0.6 mg twice daily.  2D echo ordered.  Cardiology consult appreciated  Sepsis (HCC) Secondary to viral  pericarditis but need to rule out COVID-19 infection.  Patient has mildly elevated CRP, elevated LDH and marked elevated ferritin but normal fibrinogen.  D-dimer is elevated as well. COVID-19 POC was negative.  Routine testing pending.  Continue airborne and droplet precautions. We will add gentle hydration.  Continue telemetry monitoring.  Positive HIV antibody. New finding.  Patient says he was  tested negative back in the summer while he was in [place].  Denies recent unprotected sex or having multiple partners, IV drug use.  Check CD4 count and viral load.  Transaminitis Markedly elevated AST>> ALT.  Normal bilirubin.  Check hepatitis panel, Tylenol level and right upper quadrant ultrasound.  Check INR.  GI consult if no improvement.  Pancytopenia Mild leukopenia and thrombocytopenia possibly due to acute viral illness.  Monitor closely  Patient status: Inpatient Patient presenting with sepsis with suspected acute pericarditis and being ruled out for COVID-19 infection.  He is requiring close monitoring on telemetry, IV hydration and management for acute pericarditis with persistent symptoms.  He is at high risk of further respiratory compromise and worsening of his sepsis and needs to be monitored closely in an inpatient setting for at least >2 midnight.   Code Status : Full code  Family Communication  : spoke with mother on phone. Patient finished college and came to the area from [place], [place] for his masters degree.   Disposition Plan  : Home once improved  Barriers For Discharge : Active symptoms  Consults  : Cardiology  Procedures  : CT angiogram chest, 2D echo  DVT Prophylaxis  :  Lovenox   Lab Results  Component Value Date   PLT 110 (L) 11/06/2019    Antibiotics  :    Anti-infectives (From admission, onward)   None        Objective:   Vitals:   11/06/19 0453 11/06/19 0614 11/06/19 0722 11/06/19 1251  BP: 131/71 104/65  137/81  Pulse: (!) 121 90  80  Resp:  18 18  16   Temp: 98.7 F (37.1 C) (!) 100.5 F (38.1 C) (!) 97.5 F (36.4 C) 98.4 F (36.9 C)  TempSrc: Oral Oral Oral Oral  SpO2: 96% 93%  94%  Weight:      Height:        Wt Readings from Last 3 Encounters:  11/06/19 95.4 kg    No intake or output data in the 24 hours ending 11/06/19 1416   Physical Exam  Gen: not in distress HEENT:  moist mucosa, supple neck Chest: clear b/l, no added sounds CVS: N S1&S2, no murmurs, rubs or gallop GI: soft, NT, ND,  Musculoskeletal: warm, no edema     Data Review:    CBC Recent Labs  Lab 11/04/19 2203 11/06/19 0505  WBC 3.6* 2.6*  HGB 16.4 15.9  HCT 50.2 48.4  PLT 127* 110*  MCV 94.7 92.9  MCH 30.9 30.5  MCHC 32.7 32.9  RDW 12.1 12.1    Chemistries  Recent Labs  Lab 11/04/19 2203 11/05/19 1932 11/06/19 0505  NA 138  --  137  K 4.0  --  4.3  CL 101  --  102  CO2 27  --  25  GLUCOSE 125*  --  104*  BUN 14  --  17  CREATININE 1.14  --  1.00  CALCIUM 8.3*  --  8.6*  MG  --   --  2.2  AST  --  1,099* 1,422*  ALT  --  394* 499*  ALKPHOS  --  71 106  BILITOT  --  1.6* 0.8   ------------------------------------------------------------------------------------------------------------------ No results for input(s): CHOL, HDL, LDLCALC, TRIG, CHOLHDL, LDLDIRECT in the last 72 hours.  No results found for: HGBA1C ------------------------------------------------------------------------------------------------------------------ No results for input(s): TSH, T4TOTAL, T3FREE, THYROIDAB in the last 72 hours.  Invalid input(s): FREET3 ------------------------------------------------------------------------------------------------------------------ Recent Labs    11/05/19  1932  FERRITIN >7,500*    Coagulation profile No results for input(s): INR, PROTIME in the last 168 hours.  Recent Labs    11/05/19 0126  DDIMER 9.51*    Cardiac Enzymes No results for input(s): CKMB, TROPONINI, MYOGLOBIN in the last 168  hours.  Invalid input(s): CK ------------------------------------------------------------------------------------------------------------------    Component Value Date/Time   BNP 27.4 11/05/2019 2143    Inpatient Medications  Scheduled Meds: . colchicine  0.6 mg Oral BID  . docusate sodium  100 mg Oral BID  . enoxaparin (LOVENOX) injection  40 mg Subcutaneous Q24H  . guaiFENesin  600 mg Oral BID  . ibuprofen  800 mg Oral TID   Continuous Infusions: PRN Meds:.acetaminophen **OR** acetaminophen, bisacodyl, ondansetron **OR** ondansetron (ZOFRAN) IV, polyethylene glycol, traMADol, zolpidem  Micro Results No results found for this or any previous visit (from the past 240 hour(s)).  Radiology Reports Dg Chest 2 View  Result Date: 11/04/2019 CLINICAL DATA:  Chest pain and shortness of breath EXAM: CHEST - 2 VIEW COMPARISON:  None. FINDINGS: The heart size and mediastinal contours are within normal limits. Both lungs are clear. The visualized skeletal structures are unremarkable. IMPRESSION: No active cardiopulmonary disease. Electronically Signed   By: Donavan Foil M.D.   On: 11/04/2019 22:05   Ct Angio Chest Pe W And/or Wo Contrast  Result Date: 11/05/2019 CLINICAL DATA:  Chest pain and body aches. EXAM: CT ANGIOGRAPHY CHEST WITH CONTRAST TECHNIQUE: Multidetector CT imaging of the chest was performed using the standard protocol during bolus administration of intravenous contrast. Multiplanar CT image reconstructions and MIPs were obtained to evaluate the vascular anatomy. CONTRAST:  191mL OMNIPAQUE IOHEXOL 350 MG/ML SOLN COMPARISON:  Chest x-ray from yesterday FINDINGS: Cardiovascular: Generous heart size likely from low volumes as there was normal appearing heart size on preceding cxr. No pericardial effusion. Accounting for intermittent motion artifact there is no evidence of pulmonary embolism. Negative aorta. Mediastinum/Nodes: No adenopathy or pneumomediastinum Lungs/Pleura: There  is no edema, consolidation, effusion, or pneumothorax. Upper Abdomen: No acute finding Musculoskeletal: Gynecomastia with nipple rings. No osseous findings. Review of the MIP images confirms the above findings. IMPRESSION: 1. Negative for pulmonary embolism or other acute finding. 2. Mild motion degradation. Electronically Signed   By: Monte Fantasia M.D.   On: 11/05/2019 04:29    Time Spent in minutes  35   Reighlynn Swiney M.D on 11/06/2019 at 2:16 PM  Between 7am to 7pm - Pager - [phone]  After 7pm go to [email or web address] - password [facility]  [facility] -  Office  [phone]

## 2019-11-06 NOTE — Consult Note (Addendum)
Cardiology Consultation:   Patient ID: Bryan Booth MRN: [id number]; DOB: 29-Jun-1996  Admit date: 11/05/2019 Date of Consult: 11/06/2019  Primary Care Provider: Patient, No Pcp Per Primary Cardiologist: No primary care provider on file.  Primary Electrophysiologist:  None    Patient Profile:   Bryan Booth is a 23 y.o. male with no significant past medical history who is being seen today for the evaluation of pericarditis at the request of Dr. Clementeen Wileman.  History of Present Illness:   Bryan Booth is a 23 year old male with no significant past medical history who presented to the hospital with complaints of sharp chest pain worse with coughing and taking a deep breath. He was noted to be febrile in the ED, 103.3 degrees Fahrenheit.   He was mildly tachycardic and pulse ox dropped to 90% with ambulation. CT of the chest was negative for pneumonia or pulmonary embolism.  LDH, ferritin, CRP and liver enzymes were elevated. His POC COVID-19 test was negative.  Follow-up test is pending. HIV test is reactive.  EKG shows sinus rhythm, 92 bpm, with mild diffuse ST elevations, consistent with acute pericarditis  The patient has been started on colchicine 0.6 mg twice daily after loading dose of 1.2 mg.  Heart Pathway Score:     History reviewed. No pertinent past medical history.  Past Surgical History:  Procedure Laterality Date  . non distended testis       Home Medications:  Prior to Admission medications   Medication Sig Start Date End Date Taking? Authorizing Provider  acetaminophen (TYLENOL) 500 MG tablet Take 1,000 mg by mouth every 6 (six) hours as needed for mild pain.   Yes [provider]  doxycycline (VIBRAMYCIN) 100 MG capsule Take 1 capsule (100 mg total) by mouth 2 (two) times daily. Patient not taking: Reported on 11/05/2019 10/12/19   Hayden Rasmussen, MD  traMADol (ULTRAM) 50 MG tablet Take 1 tablet (50 mg total) by mouth every 6 (six) hours  as needed. Patient not taking: Reported on 11/05/2019 10/12/19   Hayden Rasmussen, MD    Inpatient Medications: Scheduled Meds: . colchicine  0.6 mg Oral BID  . docusate sodium  100 mg Oral BID  . enoxaparin (LOVENOX) injection  40 mg Subcutaneous Q24H  . guaiFENesin  600 mg Oral BID   Continuous Infusions:  PRN Meds: acetaminophen **OR** acetaminophen, bisacodyl, ketorolac, ondansetron **OR** ondansetron (ZOFRAN) IV, polyethylene glycol, traMADol, zolpidem  Allergies:    Allergies  Allergen Reactions  . Peanut-Containing Drug Products Anaphylaxis  . Penicillins Other (See Comments)    Did it involve swelling of the face/tongue/throat, SOB, or low BP? Unknown Did it involve sudden or severe rash/hives, skin peeling, or any reaction on the inside of your mouth or nose? Unknown Did you need to seek medical attention at a hospital or doctor's office? Unknown When did it last happen?childhood If all above answers are "NO", may proceed with cephalosporin use.      Social History:   Social History   Socioeconomic History  . Marital status: Single    Spouse name: Not on file  . Number of children: Not on file  . Years of education: Not on file  . Highest education level: Not on file  Occupational History  . Not on file  Social Needs  . Financial resource strain: Not on file  . Food insecurity    Worry: Not on file    Inability: Not on file  . Transportation needs  Medical: Not on file    Non-medical: Not on file  Tobacco Use  . Smoking status: Never Smoker  . Smokeless tobacco: Never Used  Substance and Sexual Activity  . Alcohol use: Never    Frequency: Never  . Drug use: Never  . Sexual activity: Not on file  Lifestyle  . Physical activity    Days per week: Not on file    Minutes per session: Not on file  . Stress: Not on file  Relationships  . Social Musician on phone: Not on file    Gets together: Not on file    Attends religious  service: Not on file    Active member of club or organization: Not on file    Attends meetings of clubs or organizations: Not on file    Relationship status: Not on file  . Intimate partner violence    Fear of current or ex partner: Not on file    Emotionally abused: Not on file    Physically abused: Not on file    Forced sexual activity: Not on file  Other Topics Concern  . Not on file  Social History Narrative  . Not on file    Family History:    Family History  Problem Relation Age of Onset  . Mitral valve prolapse Mother   . CAD Mother   . Hypertension Mother      ROS:  Please see the history of present illness.   All other ROS reviewed and negative.     Physical Exam/Data:   Vitals:   11/06/19 0434 11/06/19 0453 11/06/19 0614 11/06/19 0722  BP: 122/78 131/71 104/65   Pulse: 93 (!) 121 90   Resp: 20 18 18    Temp: (!) 102.9 F (39.4 C) 98.7 F (37.1 C) (!) 100.5 F (38.1 C) (!) 97.5 F (36.4 C)  TempSrc: Oral Oral Oral Oral  SpO2: 99% 96% 93%   Weight:      Height:       No intake or output data in the 24 hours ending 11/06/19 0810 Last 3 Weights 11/06/2019 11/05/2019  Weight (lbs) 210 lb 5.1 oz 210 lb  Weight (kg) 95.4 kg 95.255 kg     Body mass index is 33.95 kg/m.   NO PE due to pt on isolation for possible COVID 19 Spoke to pt on the phone, alert/oriented, able to speak in complete sentences.   EKG:  The EKG was personally reviewed and demonstrates:  sinus rhythm, 92 bpm, with mild diffuse ST elevations, consistent with acute pericarditis. Telemetry:  Telemetry was personally reviewed and demonstrates:  Sinus rhythm in the 80's, briefly occ up to 100.  Relevant CV Studies:  Echo pending  Laboratory Data:  High Sensitivity Troponin:   Recent Labs  Lab 11/04/19 2203 11/05/19 0040  TROPONINIHS 20* 28*     Chemistry Recent Labs  Lab 11/04/19 2203 11/06/19 0505  NA 138 137  K 4.0 4.3  CL 101 102  CO2 27 25  GLUCOSE 125* 104*  BUN 14  17  CREATININE 1.14 1.00  CALCIUM 8.3* 8.6*  GFRNONAA >60 >60  GFRAA >60 >60  ANIONGAP 10 10    Recent Labs  Lab 11/05/19 1932 11/06/19 0505  PROT 6.4* 6.8  ALBUMIN 3.5 3.6  AST 1,099* 1,422*  ALT 394* 499*  ALKPHOS 71 106  BILITOT 1.6* 0.8   Hematology Recent Labs  Lab 11/04/19 2203 11/06/19 0505  WBC 3.6* 2.6*  RBC 5.30 5.21  HGB 16.4 15.9  HCT 50.2 48.4  MCV 94.7 92.9  MCH 30.9 30.5  MCHC 32.7 32.9  RDW 12.1 12.1  PLT 127* 110*   BNP Recent Labs  Lab 11/05/19 2143  BNP 27.4    DDimer  Recent Labs  Lab 11/05/19 0126  DDIMER 9.51*     Radiology/Studies:  Dg Chest 2 View  Result Date: 11/04/2019 CLINICAL DATA:  Chest pain and shortness of breath EXAM: CHEST - 2 VIEW COMPARISON:  None. FINDINGS: The heart size and mediastinal contours are within normal limits. Both lungs are clear. The visualized skeletal structures are unremarkable. IMPRESSION: No active cardiopulmonary disease. Electronically Signed   By: Jasmine PangKim  Fujinaga M.D.   On: 11/04/2019 22:05   Ct Angio Chest Pe W And/or Wo Contrast  Result Date: 11/05/2019 CLINICAL DATA:  Chest pain and body aches. EXAM: CT ANGIOGRAPHY CHEST WITH CONTRAST TECHNIQUE: Multidetector CT imaging of the chest was performed using the standard protocol during bolus administration of intravenous contrast. Multiplanar CT image reconstructions and MIPs were obtained to evaluate the vascular anatomy. CONTRAST:  100mL OMNIPAQUE IOHEXOL 350 MG/ML SOLN COMPARISON:  Chest x-ray from yesterday FINDINGS: Cardiovascular: Generous heart size likely from low volumes as there was normal appearing heart size on preceding cxr. No pericardial effusion. Accounting for intermittent motion artifact there is no evidence of pulmonary embolism. Negative aorta. Mediastinum/Nodes: No adenopathy or pneumomediastinum Lungs/Pleura: There is no edema, consolidation, effusion, or pneumothorax. Upper Abdomen: No acute finding Musculoskeletal: Gynecomastia with  nipple rings. No osseous findings. Review of the MIP images confirms the above findings. IMPRESSION: 1. Negative for pulmonary embolism or other acute finding. 2. Mild motion degradation. Electronically Signed   By: Marnee SpringJonathon  Watts M.D.   On: 11/05/2019 04:29    Assessment and Plan:   Acute pericarditis -Patient presented with sharp chest pain worse with cough and deep breathing. -Likely viral in setting of febrile illness.  -EKG shows sinus rhythm, 92 bpm, with mild diffuse ST elevations, consistent with acute pericarditis. -The patient has been started on colchicine 0.6 mg twice daily after loading dose of 1.2 mg. -Echocardiogram has been ordered  Febrile illness -Per primary team -LDH, ferritin, CRP and liver enzymes were elevated. His POC COVID-19 test was negative.  Follow-up test is pending. HIV test is reactive. -T 103.3 on presentation, 102.9 early this morning.    For questions or updates, please contact [facility] Please consult [email or web address] for contact info under     Signed, Berton BonJanine Hammond, NP  11/06/2019 8:10 AM As above; history was obtained via phone.  Patient was not examined as he is felt to be potentially Covid positive with test pending.  Briefly he is a [age] year old male with no prior cardiac history for evaluation of chest pain.  Patient states that for the past week he has had substernal chest pain intermittently.  It increases with inspiration.  It does not radiate.  No associated symptoms.  He has had general malaise.  He developed fevers at time of admission.  Troponin is 20, 28.  White blood cell count 3.6, platelet count 127.  ECG shows sinus rhythm, right axis deviation, subtle diffuse ST elevation consistent with possible pericarditis.  SGOT 1422, SGPT 499, LDH 2249, ferritin greater than 7500.  CTA showed no pulmonary embolus.  1 possible pericarditis-EKG possibly consistent with pericarditis.  He appears to have recent viral syndrome and Covid has not  been excluded.  We will arrange echocardiogram to rule out pericardial effusion.  Enzymes are not  consistent with acute coronary syndrome. I will treat with Motrin 800 mg p.o. 3 times daily and continue colchicine 0.6 mg twice daily.  Decrease to 0.6 mg daily at discharge.  2 viral syndrome-Covid not excluded.  Further management per primary care.  Olga Millers, MD

## 2019-11-06 NOTE — Progress Notes (Signed)
  Echocardiogram 2D Echocardiogram has been performed.  Bryan Booth 11/06/2019, 9:36 AM

## 2019-11-07 DIAGNOSIS — Z21 Asymptomatic human immunodeficiency virus [HIV] infection status: Secondary | ICD-10-CM

## 2019-11-07 DIAGNOSIS — I319 Disease of pericardium, unspecified: Secondary | ICD-10-CM

## 2019-11-07 DIAGNOSIS — I301 Infective pericarditis: Secondary | ICD-10-CM

## 2019-11-07 DIAGNOSIS — Z91010 Allergy to peanuts: Secondary | ICD-10-CM

## 2019-11-07 DIAGNOSIS — R7401 Elevation of levels of liver transaminase levels: Secondary | ICD-10-CM

## 2019-11-07 DIAGNOSIS — Z880 Allergy status to penicillin: Secondary | ICD-10-CM

## 2019-11-07 DIAGNOSIS — D72819 Decreased white blood cell count, unspecified: Secondary | ICD-10-CM

## 2019-11-07 LAB — PANEL 083904
HIV 1 AB: NEGATIVE
HIV 2 AB: NEGATIVE
Note: NEGATIVE

## 2019-11-07 LAB — COMPREHENSIVE METABOLIC PANEL
ALT: 455 U/L — ABNORMAL HIGH (ref 0–44)
AST: 1032 U/L — ABNORMAL HIGH (ref 15–41)
Albumin: 3.7 g/dL (ref 3.5–5.0)
Alkaline Phosphatase: 124 U/L (ref 38–126)
Anion gap: 11 (ref 5–15)
BUN: 16 mg/dL (ref 6–20)
CO2: 24 mmol/L (ref 22–32)
Calcium: 8.7 mg/dL — ABNORMAL LOW (ref 8.9–10.3)
Chloride: 100 mmol/L (ref 98–111)
Creatinine, Ser: 0.97 mg/dL (ref 0.61–1.24)
GFR calc Af Amer: >60 mL/min (ref >60)
GFR calc non Af Amer: >60 mL/min (ref >60)
Glucose, Bld: 94 mg/dL (ref 70–99)
Potassium: 4.3 mmol/L (ref 3.5–5.1)
Sodium: 135 mmol/L (ref 135–145)
Total Bilirubin: 1.6 mg/dL — ABNORMAL HIGH (ref 0.3–1.2)
Total Protein: 7 g/dL (ref 6.5–8.1)

## 2019-11-07 MED ORDER — BICTEGRAVIR-EMTRICITAB-TENOFOV 50-200-25 MG PO TABS
1.0000 | ORAL_TABLET | Freq: Every day | ORAL | Status: DC
  Administered 2019-11-08 – 2019-11-09 (×2): 1 via ORAL
  Filled 2019-11-07 (×3): qty 1

## 2019-11-07 NOTE — Progress Notes (Addendum)
PROGRESS NOTE                                                                                                                                                                                                             Patient Demographics:    Bryan Booth, is a 23 y.o. male, DOB - [birth year], ZOX:[id number]  Admit date - 11/05/2019   Admitting Physician Eduard ClosArshad N Kakrakandy, MD  Outpatient Primary MD for the patient is Patient, No Pcp Per  LOS - 1  Outpatient Specialists: None  Chief Complaint  Patient presents with  . Chest Pain       Brief Narrative 23 year old male with no past medical history presented with substernal chest pain.  Patient reports that he was working out in the cold and started having chest pain, body aches and nausea for almost a week.  Chest pain is sharp and present on coughing or taking deep breaths, no fevers or chills.  Patient however was found to be febrile in the ED with temperature of 103 F.  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 In the ED, patient was septic with fever, tachycardic and dropping O2 sat to 90% on ambulation.  EKG showed diffuse ST segment elevation in multiple leads.  Chest x-ray unremarkable.  CT angiogram of the chest negative for PE or infiltrate. Hospitalist consulted for admission to telemetry.   Subjective:   Reports minimal chest discomfort.  Patient tearful when discussed about his HIV status.   Assessment  & Plan :   Principal problem Acute pericarditis Suspect viral etiology.  Stable on telemetry.  Cardiology consult appreciated and patient started on scheduled Motrin 800 mg 3 times daily.  Given loading dose of colchicine 1.2 mg and started on colchicine 0.6 mg twice daily, plan to reduce dose to 0.6 mg daily upon discharge.  2D echo with normal LV function and trivial  pericardial effusion.  Active problems Sepsis (HCC) Secondary to viral pericarditis, tested negative for COVID-19. Sepsis currently resolved.  DC fluids  Positive HIV antibody. New finding.  Patient says he was tested negative back in the summer while [place] was in [place]  Denies recent unprotected sex or having multiple partners, IV drug use.  Patient tearful and is in denial.  ID consult appreciated.  Given her significant transaminitis other etiology including CMV and EBV infection cannot be ruled out.  However his acute pericarditis and transaminitis do support acute HIV infection. CD4 and viral panel ordered.  EBV and CMV ordered by ID. ID started him Biktarvy and plan on outpatient follow-up.   Transaminitis Markedly elevated AST>> ALT.possibly due to acute HIV infection.  Cannot rule out CMV and EBV, labs ordered.  Hepatitis panel negative LFTs slowly trending down. Tylenol level and INR normal.  Check right upper quadrant ultrasound.  Pancytopenia Mild leukopenia and thrombocytopenia possibly due to acute viral illness.  Monitor closely    Code Status : Full code  Family Communication  : spoke with mother on phone on 11/25. Patient finished college and came to the area from [place], [place] for his masters degree.  Patient did not want his mother to know about his HIV status and she has not been informed about it.  Disposition Plan  : Home once improved  Barriers For Discharge : Active symptoms  Consults  : Cardiology  Procedures  : CT angiogram chest, 2D echo  DVT Prophylaxis  :  Lovenox   Lab Results  Component Value Date   PLT 110 (L) 11/06/2019    Antibiotics  :    Anti-infectives (From admission, onward)   Start     Dose/Rate Route Frequency Ordered Stop   11/08/19 1000  bictegravir-emtricitabine-tenofovir AF (BIKTARVY) 50-200-25 MG per tablet 1 tablet     1 tablet Oral Daily 11/07/19 1030          Objective:   Vitals:   11/06/19 0722 11/06/19 1251  11/06/19 2054 11/07/19 0503  BP:  137/81 122/82 118/76  Pulse:  80 69 75  Resp:  Temp: (!) 97.5 F (36.4 C) 98.4 F (36.9 C) 98.6 F (37 C) 98.2 F (36.8 C)  TempSrc: Oral Oral Oral Oral  SpO2:  94% 96% 95%  Weight:      Height:        Wt Readings from Last 3 Encounters:  11/06/19 95.4 kg    No intake or output data in the 24 hours ending 11/07/19 1410  Physical exam Not in distress, flat affect, tearful HEENT: Moist mucosa, supple neck Chest: Clear bilaterally CVs: Normal S1-S2 GI: Soft, nondistended, nontender, bowel sounds present Musculoskeletal: Warm,     Data Review:    CBC Recent Labs  Lab 11/04/19 2203 11/06/19 0505  WBC 3.6* 2.6*  HGB 16.4 15.9  HCT 50.2 48.4  PLT 127* 110*  MCV 94.7 92.9  MCH 30.9 30.5  MCHC 32.7 32.9  RDW 12.1 12.1    Chemistries  Recent Labs  Lab 11/04/19 2203 11/05/19 1932 11/06/19 0505 11/07/19 0350  NA 138  --  137 135  K 4.0  --  4.3 4.3  CL 101  --  102 100  CO2 27  --  25 24  GLUCOSE 125*  --  104* 94  BUN 14  --  17 16  CREATININE 1.14  --  1.00 0.97  CALCIUM 8.3*  --  8.6* 8.7*  MG  --   --  2.2  --   AST  --  1,099* 1,422* 1,032*  ALT  --  394* 499* 455*  ALKPHOS  --  71 106 124  BILITOT  --  1.6* 0.8 1.6*   ------------------------------------------------------------------------------------------------------------------ No results for input(s): CHOL, HDL, LDLCALC, TRIG, CHOLHDL, LDLDIRECT in the last 72 hours.  No  results found for: HGBA1C ------------------------------------------------------------------------------------------------------------------ No results for input(s): TSH, T4TOTAL, T3FREE, THYROIDAB in the last 72 hours.  Invalid input(s): FREET3 ------------------------------------------------------------------------------------------------------------------ Recent Labs    11/05/19 1932  FERRITIN >7,500*    Coagulation profile Recent Labs  Lab 11/06/19 1512  INR 1.0     Recent Labs    11/05/19 0126  DDIMER 9.51*    Cardiac Enzymes No results for input(s): CKMB, TROPONINI, MYOGLOBIN in the last 168 hours.  Invalid input(s): CK ------------------------------------------------------------------------------------------------------------------    Component Value Date/Time   BNP 27.4 11/05/2019 2143    Inpatient Medications  Scheduled Meds: . [START ON 11/08/2019] bictegravir-emtricitabine-tenofovir AF  1 tablet Oral Daily  . colchicine  0.6 mg Oral BID  . docusate sodium  100 mg Oral BID  . enoxaparin (LOVENOX) injection  40 mg Subcutaneous Q24H  . guaiFENesin  600 mg Oral BID  . ibuprofen  800 mg Oral TID   Continuous Infusions: PRN Meds:.bisacodyl, ondansetron **OR** ondansetron (ZOFRAN) IV, polyethylene glycol, traMADol, zolpidem  Micro Results Recent Results (from the past 240 hour(s))  Novel Coronavirus, NAA (Hosp order, Send-out to Ref Lab; TAT 18-24 hrs     Status: None   Collection Time: 11/05/19  3:26 AM   Specimen: Nasopharyngeal Swab; Respiratory  Result Value Ref Range Status   SARS-CoV-2, NAA NOT DETECTED NOT DETECTED Final    Comment: (NOTE) This nucleic acid amplification test was developed and its performance characteristics determined by [employer]. Nucleic acid amplification tests include PCR and TMA. This test has not been FDA cleared or approved. This test has been authorized by FDA under an Emergency Use Authorization (EUA). This test is only authorized for the duration of time the declaration that circumstances exist justifying the authorization of the emergency use of in vitro diagnostic tests for detection of SARS-CoV-2 virus and/or diagnosis of COVID-19 infection under section 564(b)(1) of the Act, 21 U.S.C. 161WRU-0(A) (1), unless the authorization is terminated or revoked sooner. When diagnostic testing is negative, the possibility of a false negative result should be considered in the context of a  patient's recent exposures and the presence of clinical signs and symptoms consistent with COVID-19. An individual without symptoms of COVID- 19 and who is not shedding SARS-CoV-2 vi rus would expect to have a negative (not detected) result in this assay. Performed At: [facility] [place], [place] [id number] Rush Farmer MD YN:[id number]    [place]  Final    Comment: Performed at [facility] [address], [place], [place] [address]    Radiology Reports Dg Chest 2 View  Result Date: 11/04/2019 CLINICAL DATA:  Chest pain and shortness of breath EXAM: CHEST - 2 VIEW COMPARISON:  None. FINDINGS: The heart size and mediastinal contours are within normal limits. Both lungs are clear. The visualized skeletal structures are unremarkable. IMPRESSION: No active cardiopulmonary disease. Electronically Signed   By: Donavan Foil M.D.   On: 11/04/2019 22:05   Ct Angio Chest Pe W And/or Wo Contrast  Result Date: 11/05/2019 CLINICAL DATA:  Chest pain and body aches. EXAM: CT ANGIOGRAPHY CHEST WITH CONTRAST TECHNIQUE: Multidetector CT imaging of the chest was performed using the standard protocol during bolus administration of intravenous contrast. Multiplanar CT image reconstructions and MIPs were obtained to evaluate the vascular anatomy. CONTRAST:  164mL OMNIPAQUE IOHEXOL 350 MG/ML SOLN COMPARISON:  Chest x-ray from yesterday FINDINGS: Cardiovascular: Generous heart size likely from low volumes as there was normal appearing heart size on preceding cxr.  No pericardial effusion. Accounting for intermittent motion artifact there is no evidence of pulmonary embolism. Negative aorta. Mediastinum/Nodes: No adenopathy or pneumomediastinum Lungs/Pleura: There is no edema, consolidation, effusion, or pneumothorax. Upper Abdomen: No acute finding Musculoskeletal: Gynecomastia with nipple rings. No osseous findings. Review of the MIP images  confirms the above findings. IMPRESSION: 1. Negative for pulmonary embolism or other acute finding. 2. Mild motion degradation. Electronically Signed   By: Marnee Spring M.D.   On: 11/05/2019 04:29    Time Spent in minutes  35   Torria Fromer M.D on 11/07/2019 at 2:10 PM  Between 7am to 7pm - Pager - [phone]  After 7pm go to [email or web address] - password [facility]  [facility] -  Office  [phone]

## 2019-11-07 NOTE — Plan of Care (Signed)

## 2019-11-07 NOTE — Progress Notes (Signed)
Progress Note  Patient Name: Bryan Booth Date of Encounter: 11/07/2019  Primary Cardiologist: New  Subjective   No dyspnea; CP improving  Inpatient Medications    Scheduled Meds: . colchicine  0.6 mg Oral BID  . docusate sodium  100 mg Oral BID  . enoxaparin (LOVENOX) injection  40 mg Subcutaneous Q24H  . guaiFENesin  600 mg Oral BID  . ibuprofen  800 mg Oral TID   Continuous Infusions:  PRN Meds: bisacodyl, ondansetron **OR** ondansetron (ZOFRAN) IV, polyethylene glycol, traMADol, zolpidem   Vital Signs    Vitals:   11/06/19 0722 11/06/19 1251 11/06/19 2054 11/07/19 0503  BP:  137/81 122/82 118/76  Pulse:  80 69 75  Resp:  16 16 18   Temp: (!) 97.5 F (36.4 C) 98.4 F (36.9 C) 98.6 F (37 C) 98.2 F (36.8 C)  TempSrc: Oral Oral Oral Oral  SpO2:  94% 96% 95%  Weight:      Height:       No intake or output data in the 24 hours ending 11/07/19 0751 Last 3 Weights 11/06/2019 11/05/2019  Weight (lbs) 210 lb 5.1 oz 210 lb  Weight (kg) 95.4 kg 95.255 kg      Telemetry    Sinus - Personally Reviewed   Physical Exam   GEN: No acute distress.   Neck: No JVD Cardiac: RRR Respiratory: Clear to auscultation bilaterally. GI: Soft, nontender, non-distended  MS: No edema Neuro:  Nonfocal  Psych: Normal affect   Labs    High Sensitivity Troponin:   Recent Labs  Lab 11/04/19 2203 11/05/19 0040  TROPONINIHS 20* 28*      Chemistry Recent Labs  Lab 11/04/19 2203 11/05/19 1932 11/06/19 0505 11/07/19 0350  NA 138  --  137 135  K 4.0  --  4.3 4.3  CL 101  --  102 100  CO2 27  --  25 24  GLUCOSE 125*  --  104* 94  BUN 14  --  17 16  CREATININE 1.14  --  1.00 0.97  CALCIUM 8.3*  --  8.6* 8.7*  PROT  --  6.4* 6.8 7.0  ALBUMIN  --  3.5 3.6 3.7  AST  --  1,099* 1,422* 1,032*  ALT  --  394* 499* 455*  ALKPHOS  --  71 106 124  BILITOT  --  1.6* 0.8 1.6*  GFRNONAA >60  --  >60 >60  GFRAA >60  --  >60 >60  ANIONGAP 10  --  10 11      Hematology Recent Labs  Lab 11/04/19 2203 11/06/19 0505  WBC 3.6* 2.6*  RBC 5.30 5.21  HGB 16.4 15.9  HCT 50.2 48.4  MCV 94.7 92.9  MCH 30.9 30.5  MCHC 32.7 32.9  RDW 12.1 12.1  PLT 127* 110*    BNP Recent Labs  Lab 11/05/19 2143  BNP 27.4     DDimer  Recent Labs  Lab 11/05/19 0126  DDIMER 9.51*     Patient Profile     23 year old male with no prior cardiac history for evaluation of chest pain/possible pericarditis.  Troponin is 20, 28.  White blood cell count 3.6, platelet count 127.  ECG shows sinus rhythm, right axis deviation, subtle diffuse ST elevation consistent with possible pericarditis.  SGOT 1422, SGPT 499, LDH 2249, ferritin greater than 7500.  CTA showed no pulmonary embolus.  Assessment & Plan    1 possible pericarditis-as outlined previously symptoms and electrocardiogram felt possibly consistent with  pericarditis.  Also with recent viral symptoms though initial Covid testing negative.  Echocardiogram shows normal LV function and trivial pericardial effusion.  Enzymes are not consistent with acute coronary syndrome. Continue Motrin 800 mg p.o. 3 times daily and colchicine 0.6 mg twice daily.  Decrease to 0.6 mg daily at discharge.  Symptoms are improving this morning.  2 viral syndrome-Per hospitalist service.  For questions or updates, please contact [facility] Please consult [email or web address] for contact info under        Signed, Olga Millers, MD  11/07/2019, 7:51 AM

## 2019-11-07 NOTE — Consult Note (Signed)
[facility] for Infectious Disease       Reason for Consult: new HIV    Referring Physician: Dr. Clementeen Balow  Active Problems:   Pericarditis   Acute pericarditis   . colchicine  0.6 mg Oral BID  . docusate sodium  100 mg Oral BID  . enoxaparin (LOVENOX) injection  40 mg Subcutaneous Q24H  . guaiFENesin  600 mg Oral BID  . ibuprofen  800 mg Oral TID    Recommendations: Will start empiric Biktarvy tomorrow Waiting on confirmatory testing Will check EBV and CMV  Will need a 30 days supply at discharge of [place] if confirmed  Assessment: He has new positive test for HIV, though confirmatory testing remains pending.  Has pericarditis, transaminitis, leukopenia.  Differential is acute HIV, EBV, CMV.  I believe he is high risk though did not engage much in the interview to assess.  His pericarditis, transaminitis does fit with acute HIV.  I will start treatment with Biktarvy which improves overall outcomes with the acute disease and with pericarditis even though it is yet to be confirmed.  Clinical syndrome is consistent with the diagnosis.   Will check EBV and CMV as it can cause an identical syndrome as we wait for confirmation     Antibiotics: none  HPI: Caylan Schifano is a 23 y.o. male with no known pmh who presented with chest pain and EKG findings with pericarditis.  Echo with small effusion.  HIV test positive.  He did not engage much with the interview but has been tested negative recently by his report.  Found to have transaminitis and pericarditis.  COVID negative x 2.  No respiratory symptoms.  No associated n/v/d.  No weight loss.  CT scan of chest without any issues.    Review of Systems:  Constitutional: negative for fevers, chills, malaise and anorexia Respiratory: negative for cough or sputum Gastrointestinal: negative for nausea and diarrhea Integument/breast: negative for rash All other systems reviewed and are negative    PMH: no history of  illness  Social History   Tobacco Use  . Smoking status: Never Smoker  . Smokeless tobacco: Never Used  Substance Use Topics  . Alcohol use: Never    Frequency: Never  . Drug use: Never    Family History  Problem Relation Age of Onset  . Mitral valve prolapse Mother   . CAD Mother   . Hypertension Mother     Allergies  Allergen Reactions  . Peanut-Containing Drug Products Anaphylaxis  . Penicillins Other (See Comments)    Did it involve swelling of the face/tongue/throat, SOB, or low BP? Unknown Did it involve sudden or severe rash/hives, skin peeling, or any reaction on the inside of your mouth or nose? Unknown Did you need to seek medical attention at a hospital or doctor's office? Unknown When did it last happen?childhood If all above answers are "NO", may proceed with cephalosporin use.      Physical Exam:  Constitutional: in no apparent distress  Vitals:   11/06/19 2054 11/07/19 0503  BP: 122/82 118/76  Pulse: 69 75  Resp: 16 18  Temp: 98.6 F (37 C) 98.2 F (36.8 C)  SpO2: 96% 95%   EYES: anicteric ENMT: Mask on Cardiovascular: Cor RRR Respiratory: CTA B;normal respiratory effort GI: Bowel sounds are normal, liver is not enlarged, spleen is not enlarged Musculoskeletal: no pedal edema noted Skin: negatives: no rash Neuro: non-focal  Lab Results  Component Value Date   WBC 2.6 (L)  11/06/2019   HGB 15.9 11/06/2019   HCT 48.4 11/06/2019   MCV 92.9 11/06/2019   PLT 110 (L) 11/06/2019    Lab Results  Component Value Date   CREATININE 0.97 11/07/2019   BUN 16 11/07/2019   NA 135 11/07/2019   K 4.3 11/07/2019   CL 100 11/07/2019   CO2 24 11/07/2019    Lab Results  Component Value Date   ALT 455 (H) 11/07/2019   AST 1,032 (H) 11/07/2019   ALKPHOS 124 11/07/2019     Microbiology: Recent Results (from the past 240 hour(s))  Novel Coronavirus, NAA (Hosp order, Send-out to [employer]; TAT 18-24 hrs     Status: None   Collection Time:  11/05/19  3:26 AM   Specimen: Nasopharyngeal Swab; Respiratory  Result Value Ref Range Status   SARS-CoV-2, NAA NOT DETECTED NOT DETECTED Final    Comment: (NOTE) This nucleic acid amplification test was developed and its performance characteristics determined by [employer]. Nucleic acid amplification tests include PCR and TMA. This test has not been FDA cleared or approved. This test has been authorized by FDA under an Emergency Use Authorization (EUA). This test is only authorized for the duration of time the declaration that circumstances exist justifying the authorization of the emergency use of in vitro diagnostic tests for detection of SARS-CoV-2 virus and/or diagnosis of COVID-19 infection under section 564(b)(1) of the Act, 21 U.S.C. 678LFY-1(O) (1), unless the authorization is terminated or revoked sooner. When diagnostic testing is negative, the possibility of a false negative result should be considered in the context of a patient's recent exposures and the presence of clinical signs and symptoms consistent with COVID-19. An individual without symptoms of COVID- 19 and who is not shedding SARS-CoV-2 vi rus would expect to have a negative (not detected) result in this assay. Performed At: [facility] [address] [place], [place] [id number] Jolene Schimke MD ID:[id number]    Coronavirus Source NASOPHARYNGEAL  Final    Comment: Performed at [facility], [address], [place], [place] [address]    Gardiner Barefoot, MD [facility] [email or web address] 11/07/2019, 10:10 AM

## 2019-11-08 ENCOUNTER — Inpatient Hospital Stay (HOSPITAL_COMMUNITY): Payer: Self-pay

## 2019-11-08 LAB — COMPREHENSIVE METABOLIC PANEL
ALT: 530 U/L — ABNORMAL HIGH (ref 0–44)
AST: 994 U/L — ABNORMAL HIGH (ref 15–41)
Albumin: 3.7 g/dL (ref 3.5–5.0)
Alkaline Phosphatase: 129 U/L — ABNORMAL HIGH (ref 38–126)
Anion gap: 11 (ref 5–15)
BUN: 18 mg/dL (ref 6–20)
CO2: 24 mmol/L (ref 22–32)
Calcium: 8.8 mg/dL — ABNORMAL LOW (ref 8.9–10.3)
Chloride: 102 mmol/L (ref 98–111)
Creatinine, Ser: 1.02 mg/dL (ref 0.61–1.24)
GFR calc Af Amer: >60 mL/min (ref >60)
GFR calc non Af Amer: >60 mL/min (ref >60)
Glucose, Bld: 101 mg/dL — ABNORMAL HIGH (ref 70–99)
Potassium: 4.4 mmol/L (ref 3.5–5.1)
Sodium: 137 mmol/L (ref 135–145)
Total Bilirubin: 2.2 mg/dL — ABNORMAL HIGH (ref 0.3–1.2)
Total Protein: 7.2 g/dL (ref 6.5–8.1)

## 2019-11-08 LAB — CBC
HCT: 50 % (ref 39.0–52.0)
Hemoglobin: 16 g/dL (ref 13.0–17.0)
MCH: 30.4 pg (ref 26.0–34.0)
MCHC: 32 g/dL (ref 30.0–36.0)
MCV: 94.9 fL (ref 80.0–100.0)
Platelets: 153 10*3/uL (ref 150–400)
RBC: 5.27 MIL/uL (ref 4.22–5.81)
RDW: 12.3 % (ref 11.5–15.5)
WBC: 4.5 10*3/uL (ref 4.0–10.5)
nRBC: 0 % (ref 0.0–0.2)

## 2019-11-08 LAB — T-HELPER CELLS (CD4) COUNT (NOT AT ARMC)
CD4 % Helper T Cell: 20 % — ABNORMAL LOW (ref 33–65)
CD4 T Cell Abs: 401 /uL (ref 400–1790)

## 2019-11-08 LAB — GLUCOSE, CAPILLARY: Glucose-Capillary: 89 mg/dL (ref 70–99)

## 2019-11-08 LAB — HIV-1 RNA QUANT-NO REFLEX-BLD
HIV 1 RNA Quant: 5700000 copies/mL
LOG10 HIV-1 RNA: 6.756 log10copy/mL

## 2019-11-08 NOTE — Progress Notes (Signed)
PROGRESS NOTE                                                                                                                                                                                                             Bryan Booth Demographics:    Bryan Booth, is a 23 y.o. male, DOB - May 03, 1996, NLG:[id number]  Admit date - 11/05/2019   Admitting Physician Rise Patience, MD  Outpatient Primary MD for the Bryan Booth is Bryan Booth, No Pcp Per  LOS - 2  Outpatient Specialists: None  Chief Complaint  Bryan Booth presents with  . Chest Pain       Brief Narrative 23 year old male with no past medical history presented with substernal chest pain.  Bryan Booth reports that he was working out in the cold and started having chest pain, body aches and nausea for almost a week.  Chest pain is sharp and present on coughing or taking deep breaths, no fevers or chills.  Bryan Booth however was found to be febrile in the ED with temperature of 103 F.  Denies any sick contact or recent travel, exposure to Covid but reports that he is usually outdoors and works as a Secondary school teacher. In the ED, Bryan Booth was septic with fever, tachycardic and dropping O2 sat to 90% on ambulation.  EKG showed diffuse ST segment elevation in multiple leads.  Chest x-ray unremarkable.  CT angiogram of the chest negative for PE or infiltrate. Hospitalist consulted for admission to telemetry.   Subjective:   Denies further chest discomfort.  Still has flat affect and says he is overwhelmed with new diagnosis of HIV.Marland Kitchen   Assessment  & Plan :   Principal problem Acute pericarditis Suspect viral etiology likely contributed by acute HIV versus other viral illnesses like CMV or EBV.   Cardiology consult appreciated, on scheduled Motrin 800 mg 3 times daily.  Given loading dose of colchicine 1.2 mg and started on colchicine 0.6 mg twice daily, plan to reduce dose to 0.6 mg daily upon  discharge.  2D echo with normal LV function and trivial pericardial effusion. Discontinue telemetry.  Active problems Sepsis ([place]) Secondary to viral pericarditis, tested negative for COVID-19. Sepsis resolved.  Positive HIV antibody. New finding.  Bryan Booth says he was tested negative back in the summer while he was in [place].  Denies recent unprotected sex or having multiple  partners, IV drug use.  Bryan Booth tearful and is in denial.  ID consult appreciated.  Given her significant transaminitis other etiology including CMV and EBV infection cannot be ruled out.  However his acute pericarditis and transaminitis do support acute HIV infection. CD4 count of 400 with significant viral load.  EBV and CMV ordered by ID. ID started him Biktarvy and plan on outpatient follow-up.   Transaminitis Markedly elevated AST>> ALT.possibly due to acute HIV infection.  Cannot rule out CMV and EBV, labs ordered.  Hepatitis panel negative LFTs slowly trending down. Tylenol level and INR normal.  Abdominal ultrasound unremarkable for focal liver lesions and shows patent portal vein.  Pancytopenia Mild leukopenia and thrombocytopenia possibly due to acute viral illness.  Improved today.    Code Status : Full code  Family Communication  : Mother updated on the phone.  Bryan Booth finished college and [place] the [place] from [place] for his masters degree.  Bryan Booth did not want his mother to know about his HIV status and she has not been informed about it.  Disposition Plan  : Home possibly tomorrow if LFTs continues to improve and no further symptoms.  Barriers For Discharge : Active symptoms  Consults  : Cardiology, ID  Procedures  : CT angiogram chest, 2D echo, abdominal ultrasound  DVT Prophylaxis  :  Lovenox   Lab Results  Component Value Date   PLT 153 11/08/2019    Antibiotics  :    Anti-infectives (From admission, onward)   Start     Dose/Rate Route Frequency Ordered Stop   11/08/19  1000  bictegravir-emtricitabine-tenofovir AF (BIKTARVY) 50-200-25 MG per tablet 1 tablet     1 tablet Oral Daily 11/07/19 1030          Objective:   Vitals:   11/07/19 0503 11/07/19 1600 11/07/19 2048 11/08/19 0544  BP: 118/76 138/86 134/84 (!) 140/95  Pulse: 75 75 65 73  Resp: 18 18  18   Temp: 98.2 F (36.8 C) 97.6 F (36.4 C) 98.4 F (36.9 C) 98.7 F (37.1 C)  TempSrc: Oral Oral Oral Oral  SpO2: 95% 95% 96% 98%  Weight:      Height:        Wt Readings from Last 3 Encounters:  11/06/19 95.4 kg     Intake/Output Summary (Last 24 hours) at 11/08/2019 1136 Last data filed at 11/08/2019 1000 Gross per 24 hour  Intake 360 ml  Output -  Net 360 ml   Physical exam Flat affect, not in distress HEENT: Moist mucosa, supple neck, no cervical lymphadenopathy Chest: Clear CVs: Normal S1-S2 GI: Soft, nontender, nondistended Musculoskeletal: Warm, no edema     Data Review:    CBC Recent Labs  Lab 11/04/19 2203 11/06/19 0505 11/08/19 0421  WBC 3.6* 2.6* 4.5  HGB 16.4 15.9 16.0  HCT 50.2 48.4 50.0  PLT 127* 110* 153  MCV 94.7 92.9 94.9  MCH 30.9 30.5 30.4  MCHC 32.7 32.9 32.0  RDW 12.1 12.1 12.3    Chemistries  Recent Labs  Lab 11/04/19 2203 11/05/19 1932 11/06/19 0505 11/07/19 0350 11/08/19 0421  NA 138  --  137 135 137  K 4.0  --  4.3 4.3 4.4  CL 101  --  102 100 102  CO2 27  --  25 24 24   GLUCOSE 125*  --  104* 94 101*  BUN 14  --  17 16 18   CREATININE 1.14  --  1.00 0.97 1.02  CALCIUM  8.3*  --  8.6* 8.7* 8.8*  MG  --   --  2.2  --   --   AST  --  1,099* 1,422* 1,032* 994*  ALT  --  394* 499* 455* 530*  ALKPHOS  --  71 106 124 129*  BILITOT  --  1.6* 0.8 1.6* 2.2*   ------------------------------------------------------------------------------------------------------------------ No results for input(s): CHOL, HDL, LDLCALC, TRIG, CHOLHDL, LDLDIRECT in the last 72 hours.  No results found for: HGBA1C  ------------------------------------------------------------------------------------------------------------------ No results for input(s): TSH, T4TOTAL, T3FREE, THYROIDAB in the last 72 hours.  Invalid input(s): FREET3 ------------------------------------------------------------------------------------------------------------------ Recent Labs    11/05/19 1932  FERRITIN >7,500*    Coagulation profile Recent Labs  Lab 11/06/19 1512  INR 1.0    No results for input(s): DDIMER in the last 72 hours.  Cardiac Enzymes No results for input(s): CKMB, TROPONINI, MYOGLOBIN in the last 168 hours.  Invalid input(s): CK ------------------------------------------------------------------------------------------------------------------    Component Value Date/Time   BNP 27.4 11/05/2019 2143    Inpatient Medications  Scheduled Meds: . bictegravir-emtricitabine-tenofovir AF  1 tablet Oral Daily  . colchicine  0.6 mg Oral BID  . docusate sodium  100 mg Oral BID  . enoxaparin (LOVENOX) injection  40 mg Subcutaneous Q24H  . guaiFENesin  600 mg Oral BID  . ibuprofen  800 mg Oral TID   Continuous Infusions: PRN Meds:.bisacodyl, ondansetron **OR** ondansetron (ZOFRAN) IV, polyethylene glycol, traMADol, zolpidem  Micro Results Recent Results (from the past 240 hour(s))  Novel Coronavirus, NAA (Hosp order, Send-out to [facility]; TAT 18-24 hrs     Status: None   Collection Time: 11/05/19  3:26 AM   Specimen: Nasopharyngeal Swab; Respiratory  Result Value Ref Range Status   SARS-CoV-2, NAA NOT DETECTED NOT DETECTED Final    Comment: (NOTE) This nucleic acid amplification test was developed and its performance characteristics determined by [employer]. Nucleic acid amplification tests include PCR and TMA. This test has not been FDA cleared or approved. This test has been authorized by FDA under an Emergency Use Authorization (EUA). This test is only authorized for the duration of  time the declaration that circumstances exist justifying the authorization of the emergency use of in vitro diagnostic tests for detection of SARS-CoV-2 virus and/or diagnosis of COVID-19 infection under section 564(b)(1) of the Act, 21 U.S.C. 161WRU-0(A) (1), unless the authorization is terminated or revoked sooner. When diagnostic testing is negative, the possibility of a false negative result should be considered in the context of a Bryan Booth's recent exposures and the presence of clinical signs and symptoms consistent with COVID-19. An individual without symptoms of COVID- 19 and who is not shedding SARS-CoV-2 vi rus would expect to have a negative (not detected) result in this assay. Performed At: [facility] [address] [place], [place] [id number] Jolene Schimke MD YN:[id number]    Coronavirus Source NASOPHARYNGEAL  Final    Comment: Performed at [facility], [address], [place], [place] [address]    Radiology Reports Dg Chest 2 View  Result Date: 11/04/2019 CLINICAL DATA:  Chest pain and shortness of breath EXAM: CHEST - 2 VIEW COMPARISON:  None. FINDINGS: The heart size and mediastinal contours are within normal limits. Both lungs are clear. The visualized skeletal structures are unremarkable. IMPRESSION: No active cardiopulmonary disease. Electronically Signed   By: Jasmine Pang M.D.   On: 11/04/2019 22:05   Ct Angio Chest Pe W And/or Wo Contrast  Result Date: 11/05/2019 CLINICAL DATA:  Chest pain and  body aches. EXAM: CT ANGIOGRAPHY CHEST WITH CONTRAST TECHNIQUE: Multidetector CT imaging of the chest was performed using the standard protocol during bolus administration of intravenous contrast. Multiplanar CT image reconstructions and MIPs were obtained to evaluate the vascular anatomy. CONTRAST:  OMNIPAQUE IOHEXOL 350 MG/ML SOLN COMPARISON:  Chest x-ray from yesterday FINDINGS: Cardiovascular: Generous heart size likely from low volumes  as there was normal appearing heart size on preceding cxr. No pericardial effusion. Accounting for intermittent motion artifact there is no evidence of pulmonary embolism. Negative aorta. Mediastinum/Nodes: No adenopathy or pneumomediastinum Lungs/Pleura: There is no edema, consolidation, effusion, or pneumothorax. Upper Abdomen: No acute finding Musculoskeletal: Gynecomastia with nipple rings. No osseous findings. Review of the MIP images confirms the above findings. IMPRESSION: 1. Negative for pulmonary embolism or other acute finding. 2. Mild motion degradation. Electronically Signed   By: Marnee Spring M.D.   On: 11/05/2019 04:29   US Abdomen Complete  Result Date: 11/08/2019 CLINICAL DATA:  Elevated liver enzymes EXAM: ABDOMEN ULTRASOUND COMPLETE COMPARISON:  None. FINDINGS: Gallbladder: No gallstones or wall thickening visualized. There is no pericholecystic fluid. No sonographic Murphy sign noted by sonographer. Common bile duct: Diameter: 5 mm. No intrahepatic, common hepatic, or common bile duct dilatation. Liver: No focal lesion identified. Within normal limits in parenchymal echogenicity. Portal vein is patent on color Doppler imaging with normal direction of blood flow towards the liver. IVC: No abnormality visualized. Pancreas: Visualized portion unremarkable. Portions of pancreas obscured by gas. Spleen: Size and appearance within normal limits. Right Kidney: Length: 10.4 cm. Echogenicity within normal limits. No mass or hydronephrosis visualized. Left Kidney: Length: 11.7 cm. Echogenicity within normal limits. No mass or hydronephrosis visualized. Abdominal aorta: No aneurysm visualized. Other findings: No demonstrable ascites. IMPRESSION: Portions of pancreas obscured by gas. Visualized portions of pancreas appear normal. Study otherwise unremarkable. Electronically Signed   By: Bretta Bang III M.D.   On: 11/08/2019 08:09    Time Spent in minutes  35   Hernando Reali M.D on  11/08/2019 at 11:36 AM  Between 7am to 7pm - Pager - [phone]  After 7pm go to [email or web address] - password [facility]  [facility] -  Office  [phone]

## 2019-11-08 NOTE — Progress Notes (Addendum)
[facility] for Infectious Disease   Reason for visit: Follow up on HIV  Interval History: HIV RNA positive, CD4 401.  No questions.  Still in some disbelief of diagnosis.  No associate n/v/d with Biktarvy.     Physical Exam: Constitutional:  Vitals:   11/08/19 0544 11/08/19 1406  BP: (!) 140/95 136/82  Pulse: 73 80  Resp: 18   Temp: 98.7 F (37.1 C) 98.9 F (37.2 C)  SpO2: 98% 99%   patient appears in NAD Eyes: anicteric HENT: no thrush Respiratory: Normal respiratory effort; CTA B Cardiovascular: RRR GI: soft, nt, nd  Review of Systems: Constitutional: negative for fevers, chills and malaise Gastrointestinal: negative for nausea and diarrhea Integument/breast: negative for rash  Lab Results  Component Value Date   WBC 4.5 11/08/2019   HGB 16.0 11/08/2019   HCT 50.0 11/08/2019   MCV 94.9 11/08/2019   PLT 153 11/08/2019    Lab Results  Component Value Date   CREATININE 1.02 11/08/2019   BUN 18 11/08/2019   NA 137 11/08/2019   K 4.4 11/08/2019   CL 102 11/08/2019   CO2 24 11/08/2019    Lab Results  Component Value Date   ALT 530 (H) 11/08/2019   AST 994 (H) 11/08/2019   ALKPHOS 129 (H) 11/08/2019     Microbiology: Recent Results (from the past 240 hour(s))  Novel Coronavirus, NAA (Hosp order, Send-out to [facility]; TAT 18-24 hrs     Status: None   Collection Time: 11/05/19  3:26 AM   Specimen: Nasopharyngeal Swab; Respiratory  Result Value Ref Range Status   SARS-CoV-2, NAA NOT DETECTED NOT DETECTED Final    Comment: (NOTE) This nucleic acid amplification test was developed and its performance characteristics determined by [employer]. Nucleic acid amplification tests include PCR and TMA. This test has not been FDA cleared or approved. This test has been authorized by FDA under an Emergency Use Authorization (EUA). This test is only authorized for the duration of time the declaration that circumstances exist justifying the  authorization of the emergency use of in vitro diagnostic tests for detection of SARS-CoV-2 virus and/or diagnosis of COVID-19 infection under section 564(b)(1) of the Act, 21 U.S.C. 287OMV-6(H) (1), unless the authorization is terminated or revoked sooner. When diagnostic testing is negative, the possibility of a false negative result should be considered in the context of a patient's recent exposures and the presence of clinical signs and symptoms consistent with COVID-19. An individual without symptoms of COVID- 19 and who is not shedding SARS-CoV-2 vi rus would expect to have a negative (not detected) result in this assay. Performed At: [facility] [place], [place] [id number] Rush Farmer MD EZ:[id number]    [place]  Final    Comment: Performed at [facility] [address] [place], [place] [address]    Impression/Plan:  1. Acute HIV - very high viral load of over 5 million and acute transaminitis, pericarditis c/w acute HIV.   Started on [place] and can stay on this.  Will need a supply for 30 days at discharge  Will get him into our clinic this upcoming week to get him in the drug assistance program/[employer].   He has not yet disclosed this to anyone.    2.  transaminitis - from above.  Should resolve with treatment and time. Rechecking tomorrow  3.  Pericarditis - on ibuprofen and continuing.    OK from ID standpoint for discharge  and we will call him to arrange follow up this coming week. He confirms it is ok to call his phone on file [phone]) and is ok to leave a detailed message.   Please provide our address [facility] [address] [employer] [address] [place] [address] [phone]

## 2019-11-08 NOTE — Progress Notes (Signed)
Progress Note  Patient Name: Bryan Booth Date of Encounter: 11/08/2019  Primary Cardiologist: New  Subjective   No CP or dyspnea  Inpatient Medications    Scheduled Meds: . bictegravir-emtricitabine-tenofovir AF  1 tablet Oral Daily  . colchicine  0.6 mg Oral BID  . docusate sodium  100 mg Oral BID  . enoxaparin (LOVENOX) injection  40 mg Subcutaneous Q24H  . guaiFENesin  600 mg Oral BID  . ibuprofen  800 mg Oral TID   Continuous Infusions:  PRN Meds: bisacodyl, ondansetron **OR** ondansetron (ZOFRAN) IV, polyethylene glycol, traMADol, zolpidem   Vital Signs    Vitals:   11/07/19 0503 11/07/19 1600 11/07/19 2048 11/08/19 0544  BP: 118/76 138/86 134/84 (!) 140/95  Pulse: 75 75 65 73  Resp: 18 18  18   Temp: 98.2 F (36.8 C) 97.6 F (36.4 C) 98.4 F (36.9 C) 98.7 F (37.1 C)  TempSrc: Oral Oral Oral Oral  SpO2: 95% 95% 96% 98%  Weight:      Height:        Intake/Output Summary (Last 24 hours) at 11/08/2019 0721 Last data filed at 11/08/2019 0200 Gross per 24 hour  Intake 240 ml  Output -  Net 240 ml   Last 3 Weights 11/06/2019 11/05/2019  Weight (lbs) 210 lb 5.1 oz 210 lb  Weight (kg) 95.4 kg 95.255 kg      Telemetry    Sinus - Personally Reviewed   Physical Exam   GEN: No acute distress.  WD WN Neck: No JVD, supple Cardiac: RRR, no rub Respiratory: Clear to auscultation bilaterally; no wheeze GI: Soft, NT/ND MS: No edema Neuro:  Grossly intact Psych: Normal affect   Labs    High Sensitivity Troponin:   Recent Labs  Lab 11/04/19 2203 11/05/19 0040  TROPONINIHS 20* 28*      Chemistry Recent Labs  Lab 11/06/19 0505 11/07/19 0350 11/08/19 0421  NA 137 135 137  K 4.3 4.3 4.4  CL 102 100 102  CO2 25 24 24   GLUCOSE 104* 94 101*  BUN 17 16 18   CREATININE 1.00 0.97 1.02  CALCIUM 8.6* 8.7* 8.8*  PROT 6.8 7.0 7.2  ALBUMIN 3.6 3.7 3.7  AST 1,422* 1,032* 994*  ALT 499* 455* 530*  ALKPHOS 106 124 129*  BILITOT 0.8 1.6*  2.2*  GFRNONAA >60 >60 >60  GFRAA >60 >60 >60  ANIONGAP 10 11 11      Hematology Recent Labs  Lab 11/04/19 2203 11/06/19 0505 11/08/19 0421  WBC 3.6* 2.6* 4.5  RBC 5.30 5.21 5.27  HGB 16.4 15.9 16.0  HCT 50.2 48.4 50.0  MCV 94.7 92.9 94.9  MCH 30.9 30.5 30.4  MCHC 32.7 32.9 32.0  RDW 12.1 12.1 12.3  PLT 127* 110* 153    BNP Recent Labs  Lab 11/05/19 2143  BNP 27.4     DDimer  Recent Labs  Lab 11/05/19 0126  DDIMER 9.51*     Patient Profile     23 year old male with no prior cardiac history for evaluation of chest pain/possible pericarditis.  Troponin is 20, 28.  White blood cell count 3.6, platelet count 127.  ECG shows sinus rhythm, right axis deviation, subtle diffuse ST elevation consistent with possible pericarditis.  SGOT 1422, SGPT 499, LDH 2249, ferritin greater than 7500.  CTA showed no pulmonary embolus.  Assessment & Plan    1 possible pericarditis-as outlined previously symptoms and electrocardiogram felt consistent with pericarditis.  Also with recent viral symptoms as probable etiology;  initial Covid testing negative; HIV positive.  Echocardiogram shows normal LV function and trivial pericardial effusion.  Enzymes are not consistent with acute coronary syndrome. Continue Motrin 800 mg p.o. 3 times daily for 10 days and colchicine 0.6 mg twice daily (change to 0.6 mg daily at DC and continue for 3 months).    2 viral syndrome-HIV positive; per primary service  Cardiology will sign off.  Please call with questions.  Continue Motrin and colchicine as outlined above.  We will arrange outpatient follow-up in 2 to 4 weeks following discharge.  For questions or updates, please contact [place] Please consult [email or web address] for contact info under        Signed, Kirk Ruths, MD  11/08/2019, 7:21 AM

## 2019-11-09 DIAGNOSIS — B20 Human immunodeficiency virus [HIV] disease: Secondary | ICD-10-CM | POA: Diagnosis present

## 2019-11-09 DIAGNOSIS — R7401 Elevation of levels of liver transaminase levels: Secondary | ICD-10-CM | POA: Diagnosis present

## 2019-11-09 DIAGNOSIS — R079 Chest pain, unspecified: Secondary | ICD-10-CM

## 2019-11-09 DIAGNOSIS — A419 Sepsis, unspecified organism: Secondary | ICD-10-CM | POA: Diagnosis present

## 2019-11-09 LAB — COMPREHENSIVE METABOLIC PANEL
ALT: 415 U/L — ABNORMAL HIGH (ref 0–44)
AST: 424 U/L — ABNORMAL HIGH (ref 15–41)
Albumin: 3.9 g/dL (ref 3.5–5.0)
Alkaline Phosphatase: 133 U/L — ABNORMAL HIGH (ref 38–126)
Anion gap: 12 (ref 5–15)
BUN: 19 mg/dL (ref 6–20)
CO2: 22 mmol/L (ref 22–32)
Calcium: 8.8 mg/dL — ABNORMAL LOW (ref 8.9–10.3)
Chloride: 102 mmol/L (ref 98–111)
Creatinine, Ser: 1.01 mg/dL (ref 0.61–1.24)
GFR calc Af Amer: >60 mL/min (ref >60)
GFR calc non Af Amer: >60 mL/min (ref >60)
Glucose, Bld: 103 mg/dL — ABNORMAL HIGH (ref 70–99)
Potassium: 3.9 mmol/L (ref 3.5–5.1)
Sodium: 136 mmol/L (ref 135–145)
Total Bilirubin: 2.6 mg/dL — ABNORMAL HIGH (ref 0.3–1.2)
Total Protein: 7.4 g/dL (ref 6.5–8.1)

## 2019-11-09 LAB — CMV IGM: CMV IgM: <30 AU/mL (ref 0.0–29.9)

## 2019-11-09 LAB — EPSTEIN-BARR VIRUS VCA, IGM: EBV VCA IgM: <36 U/mL (ref 0.0–35.9)

## 2019-11-09 MED ORDER — BICTEGRAVIR-EMTRICITAB-TENOFOV 50-200-25 MG PO TABS: 1.0000 | ORAL_TABLET | Freq: Every day | ORAL | 0 refills | Status: DC

## 2019-11-09 MED ORDER — COLCHICINE 0.6 MG PO TABS: 0.6000 mg | ORAL_TABLET | Freq: Every day | ORAL | 3 refills | Status: DC

## 2019-11-09 MED ORDER — IBUPROFEN 800 MG PO TABS: 800.0000 mg | ORAL_TABLET | Freq: Three times a day (TID) | ORAL | 0 refills | Status: AC

## 2019-11-09 NOTE — Discharge Summary (Signed)
Physician Discharge Summary  Bryan Booth ZOX:[id number] DOB: [birth year] DOA: 11/05/2019  PCP: Patient, No Pcp Per  Admit date: 11/05/2019 Discharge date: 11/09/2019  Admitted From: Home Disposition: Home  Recommendations for Outpatient Follow-up:  #1 patient will be called from the ID office in 1 week to set up appointment and get assistance with medication.  You will need to have his LFTs checked during outpatient visit. 2.  Cardiology will call patient for outpatient follow-up in 3-4 weeks 3.  Patient given contact to call [facility] to establish care.  Home Health none Equipment/Devices: None  Discharge Condition: Fair CODE STATUS: Full code Diet recommendation regular    Discharge Diagnoses:  Principal problem   Acute viral pericarditis   Active problems   Acute HIV infection (HCC)   Sepsis (HCC)   Transaminitis  Brief narrative/HPI 23 year old male with no past medical history presented with substernal chest pain.  Patient reports that he was working out in the cold and started having chest pain, body aches and nausea for almost a week.  Chest pain is sharp and present on coughing or taking deep breaths, no fevers or chills.  Patient however was found to be febrile in the ED with temperature of 103 F.  Denies any sick contact or recent travel, exposure to Covid but reports that he is usually outdoors and works as a Retail banker. In the ED, patient was septic with fever, tachycardic and dropping O2 sat to 90% on ambulation.  EKG showed diffuse ST segment elevation in multiple leads.  Chest x-ray unremarkable.  CT angiogram of the chest negative for PE or infiltrate. Hospitalist consulted for admission to telemetry.   Hospital course   Principal problem Acute pericarditis Suspect viral etiology likely contributed by acute HIV versus other viral illnesses like CMV or EBV.   Cardiology consult appreciated, on scheduled Motrin 800 mg 3 times  daily.  Given loading dose of colchicine 1.2 mg and started on colchicine 0.6 mg twice daily, plan to reduce dose to 0.6 mg daily upon discharge.  2D echo with normal LV function and trivial pericardial effusion. Discontinue telemetry.  Active problems Sepsis (HCC) Secondary to viral pericarditis, tested negative for COVID-19. Sepsis resolved.  Acute HIV infection New diagnosis. Patient says he was tested negative back in the summer while he was in [place]. [place].  Denies recent unprotected sex or having multiple partners, IV drug use.  Patient tearful and is in denial.  ID consult appreciated.  Given her significant transaminitis other etiology including CMV and EBV infection cannot be ruled out.  However his acute pericarditis and transaminitis do support acute HIV infection. CD4 count of 400 with significant viral load.  EBV and CMV ordered by ID. ID started him Biktarvy and plan on outpatient follow-up.  Case manager will provide match letter for medication assistance.   Transaminitis Markedly elevated AST>> ALT . possibly due to acute HIV infection.  Cannot rule out CMV and EBV, labs ordered.  Hepatitis panel negative LFTs slowly trending down and in 400-500s.  Total bili mildly elevated. Tylenol level and INR normal.  Abdominal ultrasound unremarkable for focal liver lesions and shows patent portal vein. Need outpatient follow-up of his LFTs.  Patient instructed to avoid Tylenol.  Pancytopenia Mild leukopenia and thrombocytopenia possibly due to acute viral illness.  Improved       Family Communication  : Mother updated on the phone.  Patient finished college and came to the area from [place], [place] for  his masters degree.  Patient did not want his mother to know about his HIV status and she has not been informed about it.  Disposition Plan  : Home  Barriers For Discharge : Active symptoms  Consults  : Cardiology, ID  Procedures  : CT angiogram chest, 2D echo, abdominal  ultrasound   Discharge Instructions   Allergies as of 11/09/2019      Reactions   Peanut-containing Drug Products Anaphylaxis   Penicillins Other (See Comments)   Did it involve swelling of the face/tongue/throat, SOB, or low BP? Unknown Did it involve sudden or severe rash/hives, skin peeling, or any reaction on the inside of your mouth or nose? Unknown Did you need to seek medical attention at a hospital or doctor's office? Unknown When did it last happen?childhood If all above answers are "NO", may proceed with cephalosporin use.      Medication List    STOP taking these medications   acetaminophen 500 MG tablet Commonly known as: TYLENOL   doxycycline 100 MG capsule Commonly known as: VIBRAMYCIN   traMADol 50 MG tablet Commonly known as: ULTRAM     TAKE these medications   bictegravir-emtricitabine-tenofovir AF 50-200-25 MG Tabs tablet Commonly known as: BIKTARVY Take 1 tablet by mouth daily. Start taking on: November 10, 2019   colchicine 0.6 MG tablet Take 1 tablet (0.6 mg total) by mouth daily.   ibuprofen 800 MG tablet Commonly known as: ADVIL Take 1 tablet (800 mg total) by mouth 3 (three) times daily for 8 days.      Follow-up Information    Lewayne Bunting, MD Follow up.   Specialty: Cardiology Why: The office will call you to schedule a cardiology follow up for 2-4 weeks.  Contact information: [address] [place] [address] [phone]        Gardiner Barefoot, MD Follow up.   Specialty: Infectious Diseases Why: office will call Contact information: [address] [place] [address] [phone]          Allergies  Allergen Reactions  . Peanut-Containing Drug Products Anaphylaxis  . Penicillins Other (See Comments)    Did it involve swelling of the face/tongue/throat, SOB, or low BP? Unknown Did it involve sudden or severe rash/hives, skin peeling, or any reaction on the inside of your mouth  or nose? Unknown Did you need to seek medical attention at a hospital or doctor's office? Unknown When did it last happen?childhood If all above answers are "NO", may proceed with cephalosporin use.       Procedures/Studies: Dg Chest 2 View  Result Date: 11/04/2019 CLINICAL DATA:  Chest pain and shortness of breath EXAM: CHEST - 2 VIEW COMPARISON:  None. FINDINGS: The heart size and mediastinal contours are within normal limits. Both lungs are clear. The visualized skeletal structures are unremarkable. IMPRESSION: No active cardiopulmonary disease. Electronically Signed   By: Jasmine Pang M.D.   On: 11/04/2019 22:05   Ct Angio Chest Pe W And/or Wo Contrast  Result Date: 11/05/2019 CLINICAL DATA:  Chest pain and body aches. EXAM: CT ANGIOGRAPHY CHEST WITH CONTRAST TECHNIQUE: Multidetector CT imaging of the chest was performed using the standard protocol during bolus administration of intravenous contrast. Multiplanar CT image reconstructions and MIPs were obtained to evaluate the vascular anatomy. CONTRAST:  OMNIPAQUE IOHEXOL 350 MG/ML SOLN COMPARISON:  Chest x-ray from yesterday FINDINGS: Cardiovascular: Generous heart size likely from low volumes as there was normal appearing heart size on preceding cxr.  No pericardial effusion. Accounting for intermittent motion artifact there is no evidence of pulmonary embolism. Negative aorta. Mediastinum/Nodes: No adenopathy or pneumomediastinum Lungs/Pleura: There is no edema, consolidation, effusion, or pneumothorax. Upper Abdomen: No acute finding Musculoskeletal: Gynecomastia with nipple rings. No osseous findings. Review of the MIP images confirms the above findings. IMPRESSION: 1. Negative for pulmonary embolism or other acute finding. 2. Mild motion degradation. Electronically Signed   By: Monte Fantasia M.D.   On: 11/05/2019 04:29   US Abdomen Complete  Result Date: 11/08/2019 CLINICAL DATA:  Elevated liver enzymes EXAM: ABDOMEN  ULTRASOUND COMPLETE COMPARISON:  None. FINDINGS: Gallbladder: No gallstones or wall thickening visualized. There is no pericholecystic fluid. No sonographic Murphy sign noted by sonographer. Common bile duct: Diameter: 5 mm. No intrahepatic, common hepatic, or common bile duct dilatation. Liver: No focal lesion identified. Within normal limits in parenchymal echogenicity. Portal vein is patent on color Doppler imaging with normal direction of blood flow towards the liver. IVC: No abnormality visualized. Pancreas: Visualized portion unremarkable. Portions of pancreas obscured by gas. Spleen: Size and appearance within normal limits. Right Kidney: Length: 10.4 cm. Echogenicity within normal limits. No mass or hydronephrosis visualized. Left Kidney: Length: 11.7 cm. Echogenicity within normal limits. No mass or hydronephrosis visualized. Abdominal aorta: No aneurysm visualized. Other findings: No demonstrable ascites. IMPRESSION: Portions of pancreas obscured by gas. Visualized portions of pancreas appear normal. Study otherwise unremarkable. Electronically Signed   By: Lowella Grip III M.D.   On: 11/08/2019 08:09       Subjective: Continues to feel better.  No chest pain symptoms.  Discharge Exam: Vitals:   11/08/19 2119 11/09/19 0547  BP: (!) 138/93 124/90  Pulse: 71 66  Resp: 20 17  Temp: 98.3 F (36.8 C) 98.2 F (36.8 C)  SpO2: 96% 97%   Vitals:   11/08/19 1406 11/08/19 2020 11/08/19 2119 11/09/19 0547  BP: 136/82 129/80 (!) 138/93 124/90  Pulse: 80 80 71 66  Resp:  14 20 17   Temp: 98.9 F (37.2 C) 98.8 F (37.1 C) 98.3 F (36.8 C) 98.2 F (36.8 C)  TempSrc: Oral Oral Oral Oral  SpO2: 99% 96% 96% 97%  Weight:      Height:        General: Young male not in distress HEENT: Moist mucosa, supple neck Chest: Clear bilaterally CVs: Normal S1-S2 GI: Soft, nondistended, nontender, Musculoskeletal: Warm, no edema    The results of significant diagnostics from this  hospitalization (including imaging, microbiology, ancillary and laboratory) are listed below for reference.     Microbiology: Recent Results (from the past 240 hour(s))  Novel Coronavirus, NAA (Hosp order, Send-out to Ref Lab; TAT 18-24 hrs     Status: None   Collection Time: 11/05/19  3:26 AM   Specimen: Nasopharyngeal Swab; Respiratory  Result Value Ref Range Status   SARS-CoV-2, NAA NOT DETECTED NOT DETECTED Final    Comment: (NOTE) This nucleic acid amplification test was developed and its performance characteristics determined by [employer]. Nucleic acid amplification tests include PCR and TMA. This test has not been FDA cleared or approved. This test has been authorized by FDA under an Emergency Use Authorization (EUA). This test is only authorized for the duration of time the declaration that circumstances exist justifying the authorization of the emergency use of in vitro diagnostic tests for detection of SARS-CoV-2 virus and/or diagnosis of COVID-19 infection under section 564(b)(1) of the Act, 21 U.S.C. 992EQA-8(T) (1), unless the authorization is terminated or revoked sooner.  When diagnostic testing is negative, the possibility of a false negative result should be considered in the context of a patient's recent exposures and the presence of clinical signs and symptoms consistent with COVID-19. An individual without symptoms of COVID- 19 and who is not shedding SARS-CoV-2 vi rus would expect to have a negative (not detected) result in this assay. Performed At: [facility] [address] [place], [place] [id number] Jolene Schimke MD WU:[id number]    Coronavirus Source NASOPHARYNGEAL  Final    Comment: Performed at [facility], [address] [place], [place] [address]     Labs: BNP (last 3 results) Recent Labs    11/05/19 2143  BNP 27.4   Basic Metabolic Panel: Recent Labs  Lab 11/04/19 2203 11/06/19 0505 11/07/19 0350  11/08/19 0421 11/09/19 0417  NA 138 137 135 137 136  K 4.0 4.3 4.3 4.4 3.9  CL 101 102 100 102 102  CO2 GLUCOSE 125* 104* 94 101* 103*  BUN CREATININE 1.14 1.00 0.97 1.02 1.01  CALCIUM 8.3* 8.6* 8.7* 8.8* 8.8*  MG  --  2.2  --   --   --    Liver Function Tests: Recent Labs  Lab 11/05/19 1932 11/06/19 0505 11/07/19 0350 11/08/19 0421 11/09/19 0417  AST 1,099* 1,422* 1,032* 994* 424*  ALT 394* 499* 455* 530* 415*  ALKPHOS 71 106 124 129* 133*  BILITOT 1.6* 0.8 1.6* 2.2* 2.6*  PROT 6.4* 6.8 7.0 7.2 7.4  ALBUMIN 3.5 3.6 3.7 3.7 3.9   No results for input(s): LIPASE, AMYLASE in the last 168 hours. No results for input(s): AMMONIA in the last 168 hours. CBC: Recent Labs  Lab 11/04/19 2203 11/06/19 0505 11/08/19 0421  WBC 3.6* 2.6* 4.5  HGB 16.4 15.9 16.0  HCT 50.2 48.4 50.0  MCV 94.7 92.9 94.9  PLT 127* 110* 153   Cardiac Enzymes: No results for input(s): CKTOTAL, CKMB, CKMBINDEX, TROPONINI in the last 168 hours. BNP: Invalid input(s): POCBNP CBG: Recent Labs  Lab 11/08/19 0800  GLUCAP 89   D-Dimer No results for input(s): DDIMER in the last 72 hours. Hgb A1c No results for input(s): HGBA1C in the last 72 hours. Lipid Profile No results for input(s): CHOL, HDL, LDLCALC, TRIG, CHOLHDL, LDLDIRECT in the last 72 hours. Thyroid function studies No results for input(s): TSH, T4TOTAL, T3FREE, THYROIDAB in the last 72 hours.  Invalid input(s): FREET3 Anemia work up No results for input(s): VITAMINB12, FOLATE, FERRITIN, TIBC, IRON, RETICCTPCT in the last 72 hours. Urinalysis No results found for: COLORURINE, APPEARANCEUR, LABSPEC, PHURINE, GLUCOSEU, HGBUR, BILIRUBINUR, KETONESUR, PROTEINUR, UROBILINOGEN, NITRITE, LEUKOCYTESUR Sepsis Labs Invalid input(s): PROCALCITONIN,  WBC,  LACTICIDVEN Microbiology Recent Results (from the past 240 hour(s))  Novel Coronavirus, NAA (Hosp order, Send-out to Ref Lab; TAT 18-24 hrs     Status:  None   Collection Time: 11/05/19  3:26 AM   Specimen: Nasopharyngeal Swab; Respiratory  Result Value Ref Range Status   SARS-CoV-2, NAA NOT DETECTED NOT DETECTED Final    Comment: (NOTE) This nucleic acid amplification test was developed and its performance characteristics determined by [employer]. Nucleic acid amplification tests include PCR and TMA. This test has not been FDA cleared or approved. This test has been authorized by FDA under an Emergency Use Authorization (EUA). This test is only authorized for the duration of time the declaration that circumstances exist justifying the authorization of the emergency use of in vitro  diagnostic tests for detection of SARS-CoV-2 virus and/or diagnosis of COVID-19 infection under section 564(b)(1) of the Act, 21 U.S.C. 696EXB-2(W360bbb-3(b) (1), unless the authorization is terminated or revoked sooner. When diagnostic testing is negative, the possibility of a false negative result should be considered in the context of a patient's recent exposures and the presence of clinical signs and symptoms consistent with COVID-19. An individual without symptoms of COVID- 19 and who is not shedding SARS-CoV-2 vi rus would expect to have a negative (not detected) result in this assay. Performed At: [facility]  [address] [place], [place] [id number] Jolene SchimkeNagendra Sanjai MD UV:[id number]:[phone]    Coronavirus Source NASOPHARYNGEAL  Final    Comment: Performed at [facility], [address] [place], [place] [id number]     Time coordinating discharge: 35 minutes  SIGNED:   Eddie NorthNishant Tiron Suski, MD  [facility] 11/09/2019, 11:18 AM Pager   If 7PM-7AM, please contact night-coverage [email or web address] Password [email or web address]

## 2019-11-09 NOTE — Progress Notes (Signed)
Pt discharged home today per Dr. Dhungel. Pt's IV site D/C'd and WDL. Pt's VSS. Pt provided with home medication list, discharge instructions and prescriptions. Verbalized understanding. Pt left floor via WC in stable condition accompanied by NT. 

## 2019-11-09 NOTE — TOC Progression Note (Signed)
Transition of Care [facility]) - Progression Note    Patient Details  Name: Bryan Booth MRN: [id number] Date of Birth: 11-25-1996  Transition of [facility] CM/SW Contact  Joaquin Courts, RN Phone Number: 11/09/2019, 12:48 PM  Clinical Narrative:    CM spoke with patient and discussed the need to establish pcp. CM provided information about [facility] and added contact information to AVS. Patient instructed to call Monday morning when clinic is open to establish care and schedule a follow-up appointment.  Cm provided patient with [place] letter to help with cost of medications at discharge.  Patient will need to follow up with ID clinic to apply for patient assistance for [employer].      Expected Discharge Plan: Home/Self Care Barriers to Discharge: No Barriers Identified  Expected Discharge Plan and Services Expected Discharge Plan: Home/Self Care   Discharge Planning Services: CM Consult   Living arrangements for the past 2 months: Single Family Home Expected Discharge Date: 11/09/19                                     Social Determinants of Health (SDOH) Interventions    Readmission Risk Interventions No flowsheet data found.

## 2019-11-09 NOTE — Plan of Care (Signed)
  Problem: Education: Goal: Knowledge of General Education information will improve Description: Including pain rating scale, medication(s)/side effects and non-pharmacologic comfort measures Outcome: Completed/Met   Problem: Health Behavior/Discharge Planning: Goal: Ability to manage health-related needs will improve Outcome: Completed/Met   Problem: Clinical Measurements: Goal: Ability to maintain clinical measurements within normal limits will improve Outcome: Completed/Met Goal: Will remain free from infection Outcome: Completed/Met Goal: Diagnostic test results will improve Outcome: Completed/Met Goal: Respiratory complications will improve Outcome: Completed/Met Goal: Cardiovascular complication will be avoided Outcome: Completed/Met   Problem: Activity: Goal: Risk for activity intolerance will decrease Outcome: Completed/Met   Problem: Nutrition: Goal: Adequate nutrition will be maintained Outcome: Completed/Met   Problem: Coping: Goal: Level of anxiety will decrease Outcome: Completed/Met   Problem: Elimination: Goal: Will not experience complications related to bowel motility Outcome: Completed/Met Goal: Will not experience complications related to urinary retention Outcome: Completed/Met   Problem: Elimination: Goal: Will not experience complications related to bowel motility Outcome: Completed/Met Goal: Will not experience complications related to urinary retention Outcome: Completed/Met   Problem: Pain Managment: Goal: General experience of comfort will improve Outcome: Completed/Met   Problem: Safety: Goal: Ability to remain free from injury will improve Outcome: Completed/Met   Problem: Skin Integrity: Goal: Risk for impaired skin integrity will decrease Outcome: Completed/Met

## 2019-11-09 NOTE — Discharge Instructions (Signed)
Pericarditis ° °Pericarditis is inflammation of the pericardium. The pericardium is a thin, double-layered, fluid-filled sac that surrounds your heart. The pericardium protects and holds your heart in your chest cavity. °Inflammation of your pericardium can cause rubbing (friction) between the two layers when your heart beats. Fluid may also build up between the layers of the sac (pericardial effusion). °There are three types of this condition: °· Acute pericarditis. Inflammation develops suddenly and causes pericardial effusion. °· Chronic pericarditis. Inflammation may develop slowly, or it may continue after acute pericarditis and last longer than 6 months. °· Constrictive pericarditis (rare). The layers of the pericardium stiffen and develop scar tissue. The scar tissue thickens and sticks together. This makes it difficult for the heart to work in a normal way. °In most cases, pericarditis is acute and not serious. Chronic pericarditis and constrictive pericarditis are more serious and may require treatment. °What are the causes? °In most cases, the cause of this condition is not known. If a cause is found, it may be: °· An infection from a virus. °· A heart attack (myocardial infarction). °· Problems after open-heart surgery. °· Chest injury. °· Autoimmune disorder. The body's defense system (immune system) attacks healthy tissues. Examples include lupus or rheumatoid arthritis. °· Kidney failure. °· Underactive thyroid gland (hypothyroidism). °· Cancer that has spread (metastasized) to the pericardium from another part of the body. °· Treatment using high-energy X-rays (radiation). °· Certain medicines, including some seizure medicines, blood thinners, heart medicines, and antibiotics. °· Infection from a fungus or bacteria (rare). °What increases the risk? °The following factors may make you more likely to develop this condition: °· Being male. °· Being 20-50 years old. °· Having had pericarditis  before. °· Having had a recent upper respiratory tract infection. °What are the signs or symptoms? °The main symptom of this condition is chest pain. The chest pain may: °· Be in the center or the left side of your chest. °· Not go away with rest. °· Last for many hours or days. °· Worsen when you lie down and get better when you sit up and lean forward. °· Worsen when you swallow. °· Move to your back, neck, or shoulder. °Other symptoms may include: °· A chronic, dry cough. °· Heart palpitations. These may feel like rapid, fluttering, or pounding heartbeats. °· Dizziness or fainting. °· Tiredness or fatigue. °· Fever. °· Rapid breathing. °· Shortness of breath when lying down. °How is this diagnosed? °This condition is diagnosed based on medical history, physical exam, and diagnostic tests. During your physical exam, your health care provider will listen for friction while your heart beats (pericardial rub). You may also have tests, including: °· Blood tests to look for signs of infection and inflammation. °· Electrocardiogram (ECG). This test measures the electrical activity in your heart. °· Echocardiogram. This uses sound waves to make images of your heart. °· CT scan. °· MRI. °· Culture of pericardial fluid. °· Removing a tissue sample of the pericardium to look at under a microscope (biopsy). °If tests show that you may have constrictive pericarditis, a small, thin tube may be inserted into your heart to confirm the diagnosis (cardiac catheterization). °How is this treated? °Treatment for this condition depends on the cause and type of pericarditis that you have. In most cases, acute pericarditis will clear up on its own. Treatment may include: °· Limiting physical activity. °· Medicines, such as: °? NSAIDs to relieve pain and inflammation. °? Steroids to reduce inflammation. °? Colchicine to relieve pain   and inflammation. °· A procedure to remove fluid using a needle (pericardiocentesis) if fluid buildup puts  pressure on the heart. °· Surgery to remove part of the pericardium if constrictive pericarditis develops. °If another condition is causing your pericarditis, you may also need treatment for that condition. °Follow these instructions at home: °· Limit your activity as told by your health care provider until your symptoms improve. This usually includes: °? Resting or sitting for most of the day. °? No long walks. °? No exercise. °? No sports. °· Athletes may need to limit competition for several months. °· Do not use any products that contain nicotine or tobacco, such as cigarettes and e-cigarettes. If you need help quitting, ask your health care provider. °· Eat a heart-healthy diet. Ask your dietitian what foods are healthy for your heart. °· Take over-the-counter and prescription medicines only as told by your health care provider. °? If you were prescribed an antibiotic medicine, take it as told by your health care provider. Do not stop taking the antibiotic even if you start to feel better. °· Keep all follow-up visits as told by your health care provider. This is important. °Contact a health care provider if: °· You continue to have symptoms of pericarditis. °· You develop new symptoms of pericarditis. °· Your symptoms get worse. °· You have a fever. °Get help right away if: °· You have worsening chest pain.  °· You have difficulty breathing. °· You pass out. °These symptoms may represent a serious problem that is an emergency. Do not wait to see if the symptoms will go away. Get medical help right away. Call your local emergency services (911 in the [place] Do not drive yourself to the hospital. °Summary °· Pericarditis is inflammation of the pericardium. °· The pericardium is a thin, double-layered, fluid-filled sac that surrounds your heart. °· The main symptom of this condition is chest pain. °· Treatment for this condition depends on the cause and type of pericarditis that you have. °This information is not  intended to replace advice given to you by your health care provider. Make sure you discuss any questions you have with your health care provider. °Document Released: 05/24/2001 Document Revised: 01/04/2018 Document Reviewed: 01/04/2018 °Elsevier Patient Education © 2020 [employer] Inc. ° °

## 2019-11-11 ENCOUNTER — Telehealth: Payer: Self-pay | Admitting: Family

## 2019-11-11 ENCOUNTER — Telehealth: Payer: Self-pay | Admitting: Cardiology

## 2019-11-11 NOTE — Telephone Encounter (Addendum)
° ° °  Scheduler left message on vcml to call for appointment

## 2019-11-11 NOTE — Telephone Encounter (Signed)
-----   Message from Daune Perch, NP sent at 11/08/2019  8:03 AM EST ----- Regarding: hosp f/u .Please schedule this patient for a follow-up appointment and call them with that information.  Primary Cardiologist: Dr. Stanford Breed Date of Discharge: 11/08/2019 approximate Appointment Needed Within: 2-4 weeks Appointment Type: Hospital follow up for pericarditis  Thank you! Daune Perch, AGNP-C 11/08/2019  8:04 AM Pager: [phone]

## 2019-11-11 NOTE — Telephone Encounter (Signed)
Received notification regarding positive HIV test with confirmatory testing negative for HIV-1 antibodies however has a positive viral load of over 5 million which is consistent with acute HIV infection. Will need to notify DIS and schedule for an office visit as soon as possible to get into care.

## 2019-11-12 ENCOUNTER — Ambulatory Visit: Payer: Self-pay

## 2019-11-12 ENCOUNTER — Ambulatory Visit (INDEPENDENT_AMBULATORY_CARE_PROVIDER_SITE_OTHER): Payer: Self-pay | Admitting: Internal Medicine

## 2019-11-12 ENCOUNTER — Telehealth: Payer: Self-pay | Admitting: Pharmacy Technician

## 2019-11-12 ENCOUNTER — Encounter: Payer: Self-pay | Admitting: Internal Medicine

## 2019-11-12 ENCOUNTER — Other Ambulatory Visit: Payer: Self-pay

## 2019-11-12 VITALS — BP 108/71 | HR 78 | Temp 97.6°F | Ht 66.0 in | Wt 194.0 lb

## 2019-11-12 DIAGNOSIS — R7401 Elevation of levels of liver transaminase levels: Secondary | ICD-10-CM

## 2019-11-12 DIAGNOSIS — I301 Infective pericarditis: Secondary | ICD-10-CM

## 2019-11-12 DIAGNOSIS — Z113 Encounter for screening for infections with a predominantly sexual mode of transmission: Secondary | ICD-10-CM

## 2019-11-12 DIAGNOSIS — Z23 Encounter for immunization: Secondary | ICD-10-CM

## 2019-11-12 DIAGNOSIS — B20 Human immunodeficiency virus [HIV] disease: Secondary | ICD-10-CM

## 2019-11-12 DIAGNOSIS — Z79899 Other long term (current) drug therapy: Secondary | ICD-10-CM

## 2019-11-12 MED ORDER — BICTEGRAVIR-EMTRICITAB-TENOFOV 50-200-25 MG PO TABS: 1.0000 | ORAL_TABLET | Freq: Every day | ORAL | 5 refills | Status: DC

## 2019-11-12 MED FILL — BIKTARVY 50-200-25 MG TABS: 50-200-25 | 30 days supply | Qty: 30 | Fill #0

## 2019-11-12 NOTE — Telephone Encounter (Signed)
[facility] Patient Advocate Encounter  Completed and sent [employer] Advancing Access application for [place] for this patient who is uninsured.    Patient is approved 11/12/2019 through 12/13/2019.  BIN      [id number] PCN    [id number] GRP    [id number] ID        [id number]  This will bridge him until HMAP approved.   Venida Jarvis. Nadara Mustard [place] Patient [facility] for Infectious Disease Phone: [phone] Fax:  [phone]

## 2019-11-12 NOTE — Progress Notes (Signed)
Patient ID: Bryan Booth, male    DOB: 03-16-1996, 23 y.o.   MRN: [id number]  Reason for visit: to establish care as a new patient with HIV This is a follow up visit following his recent hospitalization.   HPI:   Patient was first diagnosed during his recent hospitalization last week for pericarditis.  He was tested as part routine testing during the hospitalization.  Previously tested negative .  The CD4 count is 400, viral load 5 milllion.  His main complaint during the hospitalization was chest pain and found to have pericarditis.  He also had notable transaminitis with an AST and ALT of about 1000.  This trended downwards.  His constellation of symptoms are most consistent with acute HIV.  Unfortunately, after starting Biktarvy in the hospital, he was unable to get it through the match program and left without it.  He has no new issues today and feels well otherwise.  Still adjusting to the diagnosis and some depression.  He has not discussed his diagnosis with anybody in his family or any friends.  He is a mother in [place] who he has not discussed this with.  He has no history of syphilis, gonorrhea or chlamydia.  He endorses MSM.  He has no current penile discharge, no warts or other issues.  PMH: pericarditis, HIV  Prior to Admission medications   Medication Sig Start Date End Date Taking? Authorizing Provider  colchicine 0.6 MG tablet Take 1 tablet (0.6 mg total) by mouth daily. 11/09/19  Yes Dhungel, Nishant, MD  ibuprofen (ADVIL) 800 MG tablet Take 1 tablet (800 mg total) by mouth 3 (three) times daily for 8 days. 11/09/19 11/17/19 Yes Dhungel, Nishant, MD  bictegravir-emtricitabine-tenofovir AF (BIKTARVY) 50-200-25 MG TABS tablet Take 1 tablet by mouth daily. Patient not taking: Reported on 11/12/2019 11/10/19   Eddie North, MD    Allergies  Allergen Reactions  . Peanut-Containing Drug Products Anaphylaxis  . Penicillins Other (See Comments)    Did it involve  swelling of the face/tongue/throat, SOB, or low BP? Unknown Did it involve sudden or severe rash/hives, skin peeling, or any reaction on the inside of your mouth or nose? Unknown Did you need to seek medical attention at a hospital or doctor's office? Unknown When did it last happen?childhood If all above answers are "NO", may proceed with cephalosporin use.      Social History   Tobacco Use  . Smoking status: Never Smoker  . Smokeless tobacco: Never Used  Substance Use Topics  . Alcohol use: Yes    Frequency: Never  . Drug use: Not Currently    Family History  Problem Relation Age of Onset  . Mitral valve prolapse Mother   . CAD Mother   . Hypertension Mother     Review of Systems Constitutional: negative for fevers, chills, fatigue, malaise and anorexia Gastrointestinal: negative for nausea and diarrhea Genitourinary: negative for dysuria and genital lesions and penile discharge Integument/breast: negative for rash and skin lesion(s) Hematologic/lymphatic: negative for lymphadenopathy Musculoskeletal: negative for myalgias and arthralgias All other systems reviewed and are negative    CONSTITUTIONAL:in no apparent distress  Vitals:   11/12/19 0912  BP: 108/71  Pulse: 78  Temp: 97.6 F (36.4 C)  SpO2: 100%   EYES: anicteric HENT: no thrush CARD:Cor RRR RESP:CTA B; normal respiratory effort UU:VOZDG sounds are normal, liver is not enlarged, spleen is not enlarged MS:no pedal edema noted SKIN:no rash NEURO: non-focal  Lab Results  Component Value Date  HIV1RNAQUANT 5,700,000 11/07/2019   No components found for: HIV1GENOTYPRPLUS No components found for: THELPERCELL  Assessment: new patient here with HIV.  Acute infection based on clinical syndrome. Discussed with patient treatment options and side effects, benefits of treatment, long term outcomes.  I discussed the severity of untreated HIV including higher cancer risk, opportunistic infections,  renal failure.  Also discussed needing to use condoms, partner disclosure, necessary vaccines, blood monitoring.  All questions answered.    Plan: 1) restart Biktarvy 2) labs including genotype 3) flu shot and Prevnar 4) counseling and THP 5) labs in 3 weeks and follow up with me in 4 weeks

## 2019-11-13 ENCOUNTER — Other Ambulatory Visit: Payer: Self-pay | Admitting: Internal Medicine

## 2019-11-13 LAB — CYTOLOGY, (ORAL, ANAL, URETHRAL) ANCILLARY ONLY
Chlamydia: NEGATIVE
Chlamydia: NEGATIVE
Comment: NEGATIVE
Comment: NEGATIVE
Comment: NORMAL
Comment: NORMAL
Neisseria Gonorrhea: NEGATIVE
Neisseria Gonorrhea: NEGATIVE

## 2019-11-13 LAB — URINE CYTOLOGY ANCILLARY ONLY
Chlamydia: NEGATIVE
Comment: NEGATIVE
Comment: NORMAL
Neisseria Gonorrhea: NEGATIVE

## 2019-11-13 MED ORDER — DOXYCYCLINE MONOHYDRATE 100 MG PO TABS: 100.0000 mg | ORAL_TABLET | Freq: Two times a day (BID) | ORAL | 0 refills | Status: DC

## 2019-11-13 NOTE — Addendum Note (Signed)
Addended by: Landis Gandy on: 11/13/2019 02:00 PM   Modules accepted: Orders

## 2019-11-14 ENCOUNTER — Telehealth: Payer: Self-pay | Admitting: *Deleted

## 2019-11-14 NOTE — Telephone Encounter (Signed)
-----   Message from Thayer Headings, MD sent at 11/13/2019  4:03 PM EST ----- He is positive for syphils and a pcn allergy. I have sent 30 days of doxycyline in to the pharmacy thanks

## 2019-11-14 NOTE — Telephone Encounter (Signed)
Call went to generic voicemail. RN left message asking for a return call to [place] at the [place] office. Landis Gandy, RN

## 2019-11-18 NOTE — Telephone Encounter (Signed)
RN attempted to call patient to relay results/prescription, no answer. Landis Gandy, RN

## 2019-11-18 NOTE — Telephone Encounter (Signed)
[facility] Patient Advocate Encounter   Patient has been approved for [employer] Advancing Access Patient Assistance Program for [place]  from 11/14/2019 to 12/12/2019.  I have spoken with the patient and this will bridge until UMAP active.  The billing information is same as below   Bryan Booth, [place] Patient [facility] for Infectious Disease Phone: [phone] Fax: [phone] 11/18/2019 3:58 PM

## 2019-11-19 NOTE — Telephone Encounter (Signed)
RN called [facility] to see if patient had yet picked up doxy - he has not.  His adap is not active, so the cost would be $75+.  Timmothy Sours will check the status of his application. RN still unable to reach patient by phone today, left voicemail asking him to call back.  M[facility]art is not yet set up.  Per Darnelle Maffucci, the [facility] [place] has been notified of acute HIV diagnosis and syphilis diagnosis. Landis Gandy, RN

## 2019-11-20 NOTE — Telephone Encounter (Signed)
He can go to the HD to get free treatment.

## 2019-11-22 ENCOUNTER — Other Ambulatory Visit: Payer: Self-pay | Admitting: *Deleted

## 2019-11-22 DIAGNOSIS — A539 Syphilis, unspecified: Secondary | ICD-10-CM

## 2019-11-22 DIAGNOSIS — B20 Human immunodeficiency virus [HIV] disease: Secondary | ICD-10-CM

## 2019-11-22 MED ORDER — BICTEGRAVIR-EMTRICITAB-TENOFOV 50-200-25 MG PO TABS: 1.0000 | ORAL_TABLET | Freq: Every day | ORAL | 5 refills | Status: DC

## 2019-11-22 MED ORDER — DOXYCYCLINE MONOHYDRATE 100 MG PO TABS: 100.0000 mg | ORAL_TABLET | Freq: Two times a day (BID) | ORAL | 0 refills | Status: DC

## 2019-11-22 NOTE — Telephone Encounter (Signed)
Patient is adap approved. RN sent in medication. He is picking it up today, will start today. RN cautioned him about taking doxy as prescribed, about taking it with food to reduce stomach upset, and about the reaction with sunlight.  He states he was told last night by his mother that he is not allergic to penicillin, although he does not know if he has ever had it as an adult.  Please advise if he should continue with oral doxy or if we should do injectable treatment. Landis Gandy, RN

## 2019-11-22 NOTE — Telephone Encounter (Signed)
Just do the doxy then thanks

## 2019-11-25 NOTE — Telephone Encounter (Signed)
RN left message on voicemail asking how the antibiotics are going.

## 2019-11-27 NOTE — Progress Notes (Signed)
Cardiology Clinic Note   Patient Name: Bryan Booth Date of Encounter: 11/28/2019  Primary Care Provider:  Patient, No Pcp Per Primary Cardiologist:  Kirk Ruths, MD  Patient Profile    Bryan Booth 23 year old male presents today for follow-up of his acute pericarditis, HIV infection, and transaminitis  Past Medical History    No past medical history on file. Past Surgical History:  Procedure Laterality Date  . non distended testis      Allergies  Allergies  Allergen Reactions  . Peanut-Containing Drug Products Anaphylaxis  . Penicillins Other (See Comments)    Did it involve swelling of the face/tongue/throat, SOB, or low BP? Unknown Did it involve sudden or severe rash/hives, skin peeling, or any reaction on the inside of your mouth or nose? Unknown Did you need to seek medical attention at a hospital or doctor's office? Unknown When did it last happen?childhood If all above answers are "NO", may proceed with cephalosporin use.      History of Present Illness    Bryan Booth has a past medical history of acute pericarditis, acute HIV infection, and transaminitis. On 11/05/2019 he presented to the emergency department with substernal chest pain. He reported he had been working out in the colon and started having chest pain, body aches, and nausea for almost a week. He describes his chest pain as sharp and present with coughing or taking deep breaths, he did not notice fever or chills. 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 His temperature in the emergency department was [place]. In the ED he was septic with fever, tachycardia and his oxygen saturation would drop to 90% with ambulation. His EKG showed sinus tachycardia. Chest x-ray was unremarkable. His echocardiogram from 11/06/2019 showed an LVEF of 50 to 55%, mildly LVH, trace mitral valve regurgitation, and trivial  pericardial effusion. He was started on Motrin 800 mg 3 times daily. Given a loading dose of colchicine 1.2 mg and started on colchicine 0.6 mg twice daily with a plan to reduce to 0.6 mg daily at discharge.  He presents to the clinic today for follow-up and states he feels occasional chest discomfort when laying back.  If he lays on his right or left side he does not experience any chest discomfort.  He has not noticed any substernal chest pain since being discharged from the hospital.  He states he is still taking Motrin 800 mg daily.  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  He states he follows a regular diet.  He has been compliant with his colchicine 0.6 mg daily.  His echocardiogram results were reviewed with him in detail.  He has been seen by ID for his HIV and has a follow-up in 2 weeks.  He denies chest pain, shortness of breath, lower extremity edema, fatigue, palpitations, melena, hematuria, hemoptysis, diaphoresis, weakness, presyncope, syncope, orthopnea, and PND.   Home Medications    Prior to Admission medications   Medication Sig Start Date End Date Taking? Authorizing Provider  bictegravir-emtricitabine-tenofovir AF (BIKTARVY) 50-200-25 MG TABS tablet Take 1 tablet by mouth daily. 11/22/19   Comer, Okey Regal, MD  colchicine 0.6 MG tablet Take 1 tablet (0.6 mg total) by mouth daily. 11/09/19   Dhungel, Flonnie Overman, MD  doxycycline (ADOXA) 100 MG tablet Take 1 tablet (100 mg total) by mouth 2 (two) times daily.  11/22/19   Gardiner Barefootomer, Robert W, MD    Family History    Family History  Problem Relation Age of Onset  . Mitral valve prolapse Mother   . CAD Mother   . Hypertension Mother    He indicated that the status of his mother is unknown.  Social History    Social History   Socioeconomic History  . Marital status: Single    Spouse name: Not on file  . Number of children: Not on file  . Years of education: Not on  file  . Highest education level: Not on file  Occupational History  . Not on file  Tobacco Use  . Smoking status: Never Smoker  . Smokeless tobacco: Never Used  Substance and Sexual Activity  . Alcohol use: Yes  . Drug use: Not Currently  . Sexual activity: Not Currently    Partners: Male    Birth control/protection: Condom  Other Topics Concern  . Not on file  Social History Narrative  . Not on file   Social Determinants of Health   Financial Resource Strain:   . Difficulty of Paying Living Expenses: Not on file  Food Insecurity:   . Worried About Programme researcher, broadcasting/film/videounning Out of Food in the Last Year: Not on file  . Ran Out of Food in the Last Year: Not on file  Transportation Needs:   . Lack of Transportation (Medical): Not on file  . Lack of Transportation (Non-Medical): Not on file  Physical Activity:   . Days of Exercise per Week: Not on file  . Minutes of Exercise per Session: Not on file  Stress:   . Feeling of Stress : Not on file  Social Connections:   . Frequency of Communication with Friends and Family: Not on file  . Frequency of Social Gatherings with Friends and Family: Not on file  . Attends Religious Services: Not on file  . Active Member of Clubs or Organizations: Not on file  . Attends BankerClub or Organization Meetings: Not on file  . Marital Status: Not on file  Intimate Partner Violence:   . Fear of Current or Ex-Partner: Not on file  . Emotionally Abused: Not on file  . Physically Abused: Not on file  . Sexually Abused: Not on file     Review of Systems    General:  No chills, fever, night sweats or weight changes.  Cardiovascular:  No chest pain, dyspnea on exertion, edema, orthopnea, palpitations, paroxysmal nocturnal dyspnea. Dermatological: No rash, lesions/masses Respiratory: No cough, dyspnea Urologic: No hematuria, dysuria Abdominal:   No nausea, vomiting, diarrhea, bright red blood per rectum, melena, or hematemesis Neurologic:  No visual changes, wkns,  changes in mental status. All other systems reviewed and are otherwise negative except as noted above.  Physical Exam    VS:  BP 132/74   Pulse 62   Temp 98.4 F (36.9 C)   Ht 5\' 6"  (1.676 m)   Wt 201 lb (91.2 kg)   SpO2 97%   BMI 32.44 kg/m  , BMI Body mass index is 32.44 kg/m. GEN: Well nourished, well developed, in no acute distress. HEENT: normal. Neck: Supple, no JVD, carotid bruits, or masses. Cardiac: RRR, no murmurs, rubs, or gallops. No clubbing, cyanosis, edema.  Radials/DP/PT 2+ and equal bilaterally.  Respiratory:  Respirations regular and unlabored, clear to auscultation bilaterally. GI: Soft, nontender, nondistended, BS + x 4. MS: no deformity or atrophy. Skin: warm and dry, no rash. Neuro:  Strength and sensation are intact.  Psych: Normal affect.  Accessory Clinical Findings    ECG personally reviewed by me today- none today - No acute changes  EKG 11/06/2019 Normal sinus rhythm left ventricular hypertrophy 93 bpm  Echocardiogram 11/06/2019 IMPRESSIONS    1. Left ventricular ejection fraction, by visual estimation, is 50 to 55%. The left ventricle has normal function. There is mildly increased left ventricular hypertrophy.  2. Global right ventricle has normal systolic function.The right ventricular size is normal. No increase in right ventricular wall thickness.  3. Normal pulmonary artery systolic pressure.  4. Left atrial size was normal.  5. Right atrial size was normal.  6. The mitral valve is normal in structure. Trace mitral valve regurgitation. No evidence of mitral stenosis.  7. The tricuspid valve is normal in structure. Tricuspid valve regurgitation is trivial.  8. The aortic valve is normal in structure. Aortic valve regurgitation is trivial. No evidence of aortic valve sclerosis or stenosis.  9. The pulmonic valve was normal in structure. Pulmonic valve regurgitation is trivial. 10. The inferior vena cava is normal in size with greater than  50% respiratory variability, suggesting right atrial pressure of 3 mmHg. 11. Trivial pericardial effusion is present. No findings to suggest constrictive physiology.  Assessment & Plan   1. Acute pericarditis-echocardiogram showed normal LV function and trivial pericardial effusion. Stop Motrin Continue colchicine 0.6 mg daily for 3 months. Recommend follow-up echocardiogram at 3 months Heart healthy low-sodium diet-salty 6 given Maintain physical activity  2 HIV infection-patient compliant with ID appointments and medication regimen. Continue BIKTARVY 50-200-25 mg daily Followed by ID  Disposition: Follow-up with Dr. Jens Som in 3 months.  Thomasene Ripple. Alithia Zavaleta NP-C     [facility] [address] Office [phone] Fax [phone]

## 2019-11-28 ENCOUNTER — Other Ambulatory Visit: Payer: Self-pay

## 2019-11-28 ENCOUNTER — Encounter: Payer: Self-pay | Admitting: General Practice

## 2019-11-28 ENCOUNTER — Ambulatory Visit (INDEPENDENT_AMBULATORY_CARE_PROVIDER_SITE_OTHER): Payer: Self-pay | Admitting: General Practice

## 2019-11-28 VITALS — BP 132/74 | HR 62 | Temp 98.4°F | Ht 66.0 in | Wt 201.0 lb

## 2019-11-28 DIAGNOSIS — B20 Human immunodeficiency virus [HIV] disease: Secondary | ICD-10-CM

## 2019-11-28 DIAGNOSIS — I309 Acute pericarditis, unspecified: Secondary | ICD-10-CM

## 2019-11-28 NOTE — Patient Instructions (Signed)
Medication Instructions:  STOP MOTRIN  CONTINUE COLCHICINE UNTIL 02-05-2020   If you need a refill on your cardiac medications before your next appointment, please call your pharmacy.  Testing/Procedures: Echocardiogram(IN MARCH) - Your physician has requested that you have an echocardiogram. Echocardiography is a painless test that uses sound waves to create images of your heart. It provides your doctor with information about the size and shape of your heart and how well your heart's chambers and valves are working. This procedure takes approximately one hour. There are no restrictions for this procedure. This will be performed at our [facility] location - [address].  Special Instructions: PLEASE READ AND FOLLOW SALTY 6 (ATTACHED)  Reduce your risk of getting COVID-19 With your heart disease it is especially important for people at increased risk of severe illness from COVID-19, and those who live with them, to protect themselves from getting COVID-19. The best way to protect yourself and to help reduce the spread of the virus that causes COVID-19 is to: Marland Kitchen Limit your interactions with other people as much as possible. . Take precautions to prevent getting COVID-19 when you do interact with others. If you start feeling sick and think you may have COVID-19, get in touch with your healthcare provider within 24 hours.  Follow-Up: IN 3 months In Person Kirk Ruths, MD ONLY-AFTER ECHO.    At [facility], you and your health needs are our priority.  As part of our continuing mission to provide you with exceptional heart care, we have created designated Provider Care Teams.  These Care Teams include your primary Cardiologist (physician) and Advanced Practice Providers (APPs -  Physician Assistants and Nurse Practitioners) who all work together to provide you with the care you need, when you need it.  Thank you for choosing [facility]!!     Happy  Holidays!!

## 2019-12-03 ENCOUNTER — Telehealth: Payer: Self-pay | Admitting: *Deleted

## 2019-12-03 NOTE — Telephone Encounter (Signed)
Patient called to check in, returned nurse's call. He says the biktarvy and doxycycline are going well, he has met with the cardiologist. He has labs next week for his first follow up. He is unable to go home to [facility] for Christmas, but has plans to call his family. He has 1 roommate who will be in town for Christmas, too. He has been feeling a little more down lately, is agreeable to [facility] referral for social support. RN printed facesheet, gave it to Patience (in clinic today), gave Demarcus the phone number to call for [facility] Intake as well. Landis Gandy, RN

## 2019-12-09 LAB — LIPID PANEL
Cholesterol: 143 mg/dL (ref <200)
HDL: 44 mg/dL (ref >=40)
LDL Cholesterol (Calc): 79 mg/dL (calc)
Non-HDL Cholesterol (Calc): 99 mg/dL (calc) (ref <130)
Total CHOL/HDL Ratio: 3.3 (calc) (ref <5.0)
Triglycerides: 123 mg/dL (ref <150)

## 2019-12-09 LAB — COMPLETE METABOLIC PANEL WITH GFR
AG Ratio: 1.5 (calc) (ref 1.0–2.5)
ALT: 326 U/L — ABNORMAL HIGH (ref 9–46)
AST: 130 U/L — ABNORMAL HIGH (ref 10–40)
Albumin: 4.5 g/dL (ref 3.6–5.1)
Alkaline phosphatase (APISO): 93 U/L (ref 36–130)
BUN: 13 mg/dL (ref 7–25)
CO2: 27 mmol/L (ref 20–32)
Calcium: 9.7 mg/dL (ref 8.6–10.3)
Chloride: 104 mmol/L (ref 98–110)
Creat: 1.04 mg/dL (ref 0.60–1.35)
GFR, Est African American: 116 mL/min/{1.73_m2} (ref >=60)
GFR, Est Non African American: 100 mL/min/{1.73_m2} (ref >=60)
Globulin: 3 g/dL (calc) (ref 1.9–3.7)
Glucose, Bld: 105 mg/dL — ABNORMAL HIGH (ref 65–99)
Potassium: 4 mmol/L (ref 3.5–5.3)
Sodium: 139 mmol/L (ref 135–146)
Total Bilirubin: 1.1 mg/dL (ref 0.2–1.2)
Total Protein: 7.5 g/dL (ref 6.1–8.1)

## 2019-12-09 LAB — QUANTIFERON-TB GOLD PLUS
Mitogen-NIL: 7.72 IU/mL
NIL: 0.02 IU/mL
QuantiFERON-TB Gold Plus: NEGATIVE
TB1-NIL: 0.02 IU/mL
TB2-NIL: 0.02 IU/mL

## 2019-12-09 LAB — HEPATITIS B SURFACE ANTIBODY,QUALITATIVE: Hep B S Ab: REACTIVE — AB

## 2019-12-09 LAB — RPR TITER: RPR Titer: 1:4 {titer} — ABNORMAL HIGH

## 2019-12-09 LAB — FLUORESCENT TREPONEMAL AB(FTA)-IGG-BLD: Fluorescent Treponemal ABS: REACTIVE — AB

## 2019-12-09 LAB — HIV-1 RNA ULTRAQUANT REFLEX TO GENTYP+
HIV 1 RNA Quant: 67200 copies/mL — ABNORMAL HIGH
HIV-1 RNA Quant, Log: 4.83 Log copies/mL — ABNORMAL HIGH

## 2019-12-09 LAB — HEPATITIS A ANTIBODY, TOTAL: Hepatitis A AB,Total: REACTIVE — AB

## 2019-12-09 LAB — RPR: RPR Ser Ql: REACTIVE — AB

## 2019-12-09 LAB — HIV-1 GENOTYPE: HIV-1 Genotype: DETECTED — AB

## 2019-12-09 LAB — HEPATITIS B CORE ANTIBODY, TOTAL: Hep B Core Total Ab: NONREACTIVE

## 2019-12-10 ENCOUNTER — Other Ambulatory Visit: Payer: Self-pay

## 2019-12-10 DIAGNOSIS — B20 Human immunodeficiency virus [HIV] disease: Secondary | ICD-10-CM

## 2019-12-11 LAB — T-HELPER CELL (CD4) - (RCID CLINIC ONLY)
CD4 % Helper T Cell: 31 % — ABNORMAL LOW (ref 33–65)
CD4 T Cell Abs: 488 /uL (ref 400–1790)

## 2019-12-15 LAB — COMPLETE METABOLIC PANEL WITH GFR
AG Ratio: 1.6 (calc) (ref 1.0–2.5)
ALT: 16 U/L (ref 9–46)
AST: 15 U/L (ref 10–40)
Albumin: 4.2 g/dL (ref 3.6–5.1)
Alkaline phosphatase (APISO): 55 U/L (ref 36–130)
BUN: 21 mg/dL (ref 7–25)
CO2: 29 mmol/L (ref 20–32)
Calcium: 9.4 mg/dL (ref 8.6–10.3)
Chloride: 107 mmol/L (ref 98–110)
Creat: 0.96 mg/dL (ref 0.60–1.35)
GFR, Est African American: 128 mL/min/{1.73_m2} (ref >=60)
GFR, Est Non African American: 110 mL/min/{1.73_m2} (ref >=60)
Globulin: 2.7 g/dL (calc) (ref 1.9–3.7)
Glucose, Bld: 112 mg/dL — ABNORMAL HIGH (ref 65–99)
Potassium: 4.1 mmol/L (ref 3.5–5.3)
Sodium: 143 mmol/L (ref 135–146)
Total Bilirubin: 0.9 mg/dL (ref 0.2–1.2)
Total Protein: 6.9 g/dL (ref 6.1–8.1)

## 2019-12-15 LAB — HIV-1 RNA QUANT-NO REFLEX-BLD
HIV 1 RNA Quant: 41 {copies}/mL — ABNORMAL HIGH
HIV-1 RNA Quant, Log: 1.61 {Log_copies}/mL — ABNORMAL HIGH

## 2019-12-17 ENCOUNTER — Encounter: Payer: Self-pay | Admitting: Internal Medicine

## 2019-12-25 ENCOUNTER — Encounter: Payer: Self-pay | Admitting: Internal Medicine

## 2019-12-25 ENCOUNTER — Ambulatory Visit (INDEPENDENT_AMBULATORY_CARE_PROVIDER_SITE_OTHER): Payer: Self-pay | Admitting: Internal Medicine

## 2019-12-25 ENCOUNTER — Telehealth: Payer: Self-pay | Admitting: *Deleted

## 2019-12-25 ENCOUNTER — Other Ambulatory Visit: Payer: Self-pay

## 2019-12-25 ENCOUNTER — Ambulatory Visit: Payer: Self-pay | Admitting: *Deleted

## 2019-12-25 VITALS — BP 148/80 | HR 72 | Temp 98.0°F | Ht 67.0 in | Wt 209.0 lb

## 2019-12-25 DIAGNOSIS — Z23 Encounter for immunization: Secondary | ICD-10-CM

## 2019-12-25 DIAGNOSIS — Z21 Asymptomatic human immunodeficiency virus [HIV] infection status: Secondary | ICD-10-CM

## 2019-12-25 DIAGNOSIS — R7401 Elevation of levels of liver transaminase levels: Secondary | ICD-10-CM

## 2019-12-25 DIAGNOSIS — B20 Human immunodeficiency virus [HIV] disease: Secondary | ICD-10-CM

## 2019-12-25 NOTE — Progress Notes (Signed)
Menveo #1 of 2 administered today. Patient will schedule a nurse visit in 8 weeks for #2 menveo injection. Patient tolerated well. Valarie Cones

## 2019-12-25 NOTE — Assessment & Plan Note (Signed)
Likely from the acute HIV and now resolved.  Labs prior to this visit are wnl.

## 2019-12-25 NOTE — Addendum Note (Signed)
Addended by: Valarie Cones on: 12/25/2019 03:07 PM   Modules accepted: Orders

## 2019-12-25 NOTE — Assessment & Plan Note (Signed)
Discussed Menveo and #1 given today

## 2019-12-25 NOTE — Progress Notes (Signed)
   Subjective:    Patient ID: Bryan Booth, adult    DOB: November 11, 1996, 24 y.o.   MRN: [id number]  HPI Here for follow up of HIV Newly diagnosed with acute HIV in November associated with acute pericarditis.  Started on [place] and no associated n/v/d.  Has been back to cardiology and no issues.  Labs now with a CD4 of 488 and viral load down to just 41 copies.  No complaints.     Review of Systems  Constitutional: Negative for fatigue.  Gastrointestinal: Negative for diarrhea and nausea.  Skin: Negative for rash.       Objective:   Physical Exam Constitutional:      Appearance: Normal appearance.  Eyes:     General: No scleral icterus. Cardiovascular:     Rate and Rhythm: Normal rate and regular rhythm.     Heart sounds: No murmur.  Pulmonary:     Effort: Pulmonary effort is normal.  Neurological:     General: No focal deficit present.     Mental Status: He is alert.   SH: no tobacco        Assessment & Plan:

## 2019-12-25 NOTE — Progress Notes (Addendum)
Meet with Bryan Booth today during his visit with Dr. Luciana Booth. Focus of today's visit is to assist/support Bryan Booth with managing his new diagnosis. Bryan Booth and I discussed that I will be available to him for social support, linkage to available community resources, assistance with staying in care with Dr. Luciana Booth and education when he needs it. Bryan Booth appreciated the assistance and states it has been a struggle to accept his new diagnosis. Consents signed at this time with contact information provided as well.  Bryan Booth denies concerns with medication adherence at this time. He has shared his status with his partner, partner testing offered at this time but Bryan Booth feels that his partner was already aware and offered apologies. Plan at this time is to offer support and encouragment

## 2019-12-25 NOTE — Telephone Encounter (Signed)
Referral/Order received on 12/25/2019 by Dr. Luciana Axe to evaluate patient for [facility]).  Patient was evaluated on  for [facility]NS. Patient was consented to care at this time.   Frequency / Duration of CBHCN visits: Effective:12/25/2019 to 03/24/2020 75mo4 3 PRN's for complications with disease process/progression, medication changes or concerns   [facility]N will assess for learning needs related to diagnosis and treatment regimen, provide education as needed, fill pill box if needed, address any barriers which may be preventing medication compliance, and communicating with care team including physician and case manager.   Individualized Plan Of Care 12/25/2019 to 03/24/2020  a. Type of service(s) and care to be delivered: RN Case Management  b. Frequency and duration of service: Effective 7mo4 3 prns for  complications with disease process/progression, medication changes or concerns . Visits/Contact may be conducted telephonically or in person to best suit the patient.  c. Activity restrictions: Pt may be up as tolerated and can safely ambulate without the need for a assistive device   d. Safety Measures: Standard Precautions/Infection Control   e. Service Objectives and Goals: Service Objectives are to assist the pt with HIV medication regimen adherence and staying in care with the Infectious Disease Clinic by identifying barriers to care. RN will address the barriers that are identified by the patient. Patient stated goal is to receive support with his new HIV diagnosis  f. Equipment required: No additional equipment needs at this time   g. Functional Limitations: None noted   h. Rehabilitation potential: Good  i. Diet and Nutritional Needs: Regular Diet   j. Medications and treatments: Medications have been reconciled and reviewed and are a part of EPIC electronic file   k. Specific therapies if needed: RN   l. Pertinent diagnoses: New HIV diagnosis  m. Expected  outcome: Guarded

## 2019-12-25 NOTE — Assessment & Plan Note (Signed)
Doing well on his new regimen and no issues.  Tolerating and will continue.  rtc 3 months.

## 2019-12-26 NOTE — Telephone Encounter (Signed)
I approve of the POC as outlined 

## 2020-02-18 NOTE — Progress Notes (Deleted)
HPI: Follow-up pericarditis.  Patient admitted November 2020 with chest pain.  Echocardiogram showed ejection fraction 50 to 55%, mild left ventricular hypertrophy and trivial pericardial effusion.  He was found to be HIV positive.  Covid was negative.  He was treated with Motrin and colchicine.  Since last seen  Current Outpatient Medications  Medication Sig Dispense Refill  . bictegravir-emtricitabine-tenofovir AF (BIKTARVY) 50-200-25 MG TABS tablet Take 1 tablet by mouth daily. 30 tablet 5  . colchicine 0.6 MG tablet Take 1 tablet (0.6 mg total) by mouth daily. 30 tablet 3  . doxycycline (ADOXA) 100 MG tablet Take 1 tablet (100 mg total) by mouth 2 (two) times daily. 60 tablet 0   No current facility-administered medications for this visit.     No past medical history on file.  Past Surgical History:  Procedure Laterality Date  . non distended testis      Social History   Socioeconomic History  . Marital status: Single    Spouse name: Not on file  . Number of children: Not on file  . Years of education: Not on file  . Highest education level: Not on file  Occupational History  . Not on file  Tobacco Use  . Smoking status: Never Smoker  . Smokeless tobacco: Never Used  Substance and Sexual Activity  . Alcohol use: Yes  . Drug use: Not Currently  . Sexual activity: Not Currently    Partners: Male    Birth control/protection: Condom  Other Topics Concern  . Not on file  Social History Narrative  . Not on file   Social Determinants of Health   Financial Resource Strain:   . Difficulty of Paying Living Expenses: Not on file  Food Insecurity:   . Worried About Charity fundraiser in the Last Year: Not on file  . Ran Out of Food in the Last Year: Not on file  Transportation Needs:   . Lack of Transportation (Medical): Not on file  . Lack of Transportation (Non-Medical): Not on file  Physical Activity:   . Days of Exercise per Week: Not on file  . Minutes  of Exercise per Session: Not on file  Stress:   . Feeling of Stress : Not on file  Social Connections:   . Frequency of Communication with Friends and Family: Not on file  . Frequency of Social Gatherings with Friends and Family: Not on file  . Attends Religious Services: Not on file  . Active Member of Clubs or Organizations: Not on file  . Attends Archivist Meetings: Not on file  . Marital Status: Not on file  Intimate Partner Violence:   . Fear of Current or Ex-Partner: Not on file  . Emotionally Abused: Not on file  . Physically Abused: Not on file  . Sexually Abused: Not on file    Family History  Problem Relation Age of Onset  . Mitral valve prolapse Mother   . CAD Mother   . Hypertension Mother     ROS: no fevers or chills, productive cough, hemoptysis, dysphasia, odynophagia, melena, hematochezia, dysuria, hematuria, rash, seizure activity, orthopnea, PND, pedal edema, claudication. Remaining systems are negative.  Physical Exam: Well-developed well-nourished in no acute distress.  Skin is warm and dry.  HEENT is normal.  Neck is supple.  Chest is clear to auscultation with normal expansion.  Cardiovascular exam is regular rate and rhythm.  Abdominal exam nontender or distended. No masses palpated. Extremities show no edema.  neuro grossly intact  ECG- personally reviewed  A/P  1 recent pericarditis-  2 HIV infection-follow-up infectious disease.  Olga Millers, MD

## 2020-02-26 ENCOUNTER — Other Ambulatory Visit: Payer: Self-pay

## 2020-02-26 ENCOUNTER — Ambulatory Visit (HOSPITAL_COMMUNITY): Payer: Self-pay | Attending: Internal Medicine

## 2020-02-26 DIAGNOSIS — I309 Acute pericarditis, unspecified: Secondary | ICD-10-CM | POA: Insufficient documentation

## 2020-03-02 ENCOUNTER — Ambulatory Visit: Payer: Self-pay | Admitting: Cardiology

## 2020-03-02 NOTE — Progress Notes (Signed)
Virtual Visit via Video Note   This visit type was conducted due to national recommendations for restrictions regarding the COVID-19 Pandemic (e.g. social distancing) in an effort to limit this patient's exposure and mitigate transmission in our community.  Due to his co-morbid illnesses, this patient is at least at moderate risk for complications without adequate follow up.  This format is felt to be most appropriate for this patient at this time.  All issues noted in this document were discussed and addressed.  A limited physical exam was performed with this format.  Please refer to the patient's chart for his consent to telehealth for [facility].   Date:  03/03/2020   ID:  Bryan Booth, Bryan Booth [birth year], MRN [id number]  Patient Location:Home Provider Location: Home  PCP:  Patient, No Pcp Per  Cardiologist:  Dr Stanford Breed  Evaluation Performed:  Follow-Up Visit  Chief Complaint:  FU pericarditis  History of Present Illness:    Patient admitted with chest pain November 2020.  It was felt he likely had pericarditis.  CTA showed no pulmonary embolus.  Echocardiogram showed normal LV function and trivial pericardial effusion.  Patient tested positive for HIV.  He was treated with Motrin and colchicine.  Echocardiogram repeated March 2021 and showed normal LV function.  Since last seen he has mild chest discomfort at times related to certain movements and lying on his left side.  He denies dyspnea, palpitations or syncope.  The patient does not have symptoms concerning for COVID-19 infection (fever, chills, cough, or new shortness of breath).    No past medical history on file. Past Surgical History:  Procedure Laterality Date  . non distended testis       Current Meds  Medication Sig  . bictegravir-emtricitabine-tenofovir AF (BIKTARVY) 50-200-25 MG TABS tablet Take 1 tablet by mouth daily.  . colchicine 0.6 MG tablet Take 1 tablet (0.6 mg total) by mouth daily.      Allergies:   Peanut-containing drug products and Penicillins   Social History   Tobacco Use  . Smoking status: Never Smoker  . Smokeless tobacco: Never Used  Substance Use Topics  . Alcohol use: Yes  . Drug use: Not Currently     Family Hx: The patient's family history includes CAD in his mother; Hypertension in his mother; Mitral valve prolapse in his mother.  ROS:   Please see the history of present illness.    No Fever, chills  or productive cough All other systems reviewed and are negative.   Recent Labs: 11/05/2019: B Natriuretic Peptide 27.4 11/06/2019: Magnesium 2.2 11/08/2019: Hemoglobin 16.0; Platelets 153 12/10/2019: ALT 16; BUN 21; Creat 0.96; Potassium 4.1; Sodium 143   Recent Lipid Panel Lab Results  Component Value Date/Time   CHOL 143 11/12/2019 10:01 AM   TRIG 123 11/12/2019 10:01 AM   HDL 44 11/12/2019 10:01 AM   CHOLHDL 3.3 11/12/2019 10:01 AM   LDLCALC 79 11/12/2019 10:01 AM    Wt Readings from Last 3 Encounters:  03/03/20 200 lb (90.7 kg)  12/25/19 209 lb (94.8 kg)  11/28/19 201 lb (91.2 kg)     Objective:    Vital Signs:  Ht 5\' 7"  (1.702 m)   Wt 200 lb (90.7 kg)   BMI 31.32 kg/m    VITAL SIGNS:  reviewed NAD Answers questions appropriately Normal affect Remainder of physical examination not performed (telehealth visit; coronavirus pandemic)  ASSESSMENT & PLAN:    1. Recent pericarditis-patient symptoms have improved.  Discontinue colchicine. 2. HIV-follow-up  infectious disease.  COVID-19 Education: The importance of social distancing was discussed today.  Time:   Today, I have spent 16 minutes with the patient with telehealth technology discussing the above problems.     Medication Adjustments/Labs and Tests Ordered: Current medicines are reviewed at length with the patient today.  Concerns regarding medicines are outlined above.   Tests Ordered: No orders of the defined types were placed in this  encounter.   Medication Changes: No orders of the defined types were placed in this encounter.   Follow Up:  Either In Person or Virtual in 1 year(s)  Signed, Olga Millers, MD  03/03/2020 9:08 AM    [facility]

## 2020-03-03 ENCOUNTER — Telehealth: Payer: Self-pay | Admitting: *Deleted

## 2020-03-03 ENCOUNTER — Encounter: Payer: Self-pay | Admitting: Cardiology

## 2020-03-03 ENCOUNTER — Telehealth (INDEPENDENT_AMBULATORY_CARE_PROVIDER_SITE_OTHER): Payer: Self-pay | Admitting: Cardiology

## 2020-03-03 VITALS — Ht 67.0 in | Wt 200.0 lb

## 2020-03-03 DIAGNOSIS — B20 Human immunodeficiency virus [HIV] disease: Secondary | ICD-10-CM

## 2020-03-03 DIAGNOSIS — I309 Acute pericarditis, unspecified: Secondary | ICD-10-CM

## 2020-03-03 NOTE — Telephone Encounter (Signed)
1 st attempt for virtual visit

## 2020-03-03 NOTE — Patient Instructions (Signed)
Medication Instructions:    stop taking Colchicine  *If you need a refill on your cardiac medications before your next appointment, please call your pharmacy*   Lab Work: Not needed   Testing/Procedures: Not needed   Follow-Up: At [facility], you and your health needs are our priority.  As part of our continuing mission to provide you with exceptional heart care, we have created designated Provider Care Teams.  These Care Teams include your primary Cardiologist (physician) and Advanced Practice Providers (APPs -  Physician Assistants and Nurse Practitioners) who all work together to provide you with the care you need, when you need it.  We recommend signing up for the patient portal called "MyChart".  Sign up information is provided on this After Visit Summary.  MyChart is used to connect with patients for Virtual Visits (Telemedicine).  Patients are able to view lab/test results, encounter notes, upcoming appointments, etc.  Non-urgent messages can be sent to your provider as well.   To learn more about what you can do with MyChart, go to [email or web address].    Your next appointment:   12 month(s)  The format for your next appointment:   In Person  Provider:   Olga Millers, MD

## 2020-03-03 NOTE — Telephone Encounter (Signed)
RN left message  to patient.  Giving Instruction were   from today's virtual visit 03/03/20 .  AVS SUMMARY has been sent by mychart  And mailed.   any question may call back

## 2020-03-03 NOTE — Telephone Encounter (Signed)
2nd attempt  - left message  Calling to start  Virtual visit   will call again

## 2020-03-12 ENCOUNTER — Telehealth: Payer: Self-pay | Admitting: *Deleted

## 2020-03-12 NOTE — Telephone Encounter (Addendum)
Received a call back from [place]. He wanted to confirm his appt date and times and mychart results (HIV RNA). Education provided on what HIV RNA lab results is telling him.

## 2020-03-12 NOTE — Telephone Encounter (Signed)
RN contacted the patient today. Purpose of the call is to stay connected with the patient. RN left a message stating that I wanted to be sure all is well and to please let me know if I can assist in any way.  

## 2020-03-23 ENCOUNTER — Other Ambulatory Visit: Payer: Self-pay

## 2020-03-23 ENCOUNTER — Telehealth: Payer: Self-pay | Admitting: *Deleted

## 2020-03-23 NOTE — Telephone Encounter (Signed)
Message received from [place] stating his schedule it tight and will need to reschedule his lab appt for today.  Wed or Thurs at 4pm will work. Lab appt rescheduled for Wed at 4pm

## 2020-03-25 ENCOUNTER — Other Ambulatory Visit: Payer: Self-pay

## 2020-03-25 ENCOUNTER — Telehealth: Payer: Self-pay | Admitting: *Deleted

## 2020-03-25 NOTE — Telephone Encounter (Signed)
I agree with the POC as outlined.  

## 2020-03-25 NOTE — Telephone Encounter (Signed)
Referral/Order received on 12/25/2019 by Dr. Luciana Axe to evaluate patient for [facility]).  Patient was evaluated on  for [facility]. Patient was consented to care at this time.   Frequency / Duration of CBHCN visits: Effective:0414/2021 to 06/23/2020 85mo4 3 PRN's for complications with disease process/progression, medication changes or concerns   [facility]N will assess for learning needs related to diagnosis and treatment regimen, provide education as needed, fill pill box if needed, address any barriers which may be preventing medication compliance, and communicating with care team including physician and case manager.   Individualized Plan Of Care 03/25/2020 to 06/23/2020  a. Type of service(s) and care to be delivered: RN Case Management  b. Frequency and duration of service: Effective 52mo4 3 prns for  complications with disease process/progression, medication changes or concerns . Visits/Contact may be conducted telephonically or in person to best suit the patient.  c. Activity restrictions: Pt may be up as tolerated and can safely ambulate without the need for a assistive device   d. Safety Measures: Standard Precautions/Infection Control   e. Service Objectives and Goals: Service Objectives are to assist the pt with HIV medication regimen adherence and staying in care with the Infectious Disease Clinic by identifying barriers to care. RN will address the barriers that are identified by the patient. Patient stated goal is to receive support with his new HIV diagnosis. Most of the communication is via text but it appears to be helpful and supportive.   f. Equipment required: No additional equipment needs at this time   g. Functional Limitations: None noted   h. Rehabilitation potential: Good  i. Diet and Nutritional Needs: Regular Diet   j. Medications and treatments: Medications have been reconciled and reviewed and are a part of EPIC electronic file   k. Specific  therapies if needed: RN   l. Pertinent diagnoses: New HIV diagnosis  m. Expected outcome: Guarded

## 2020-03-26 ENCOUNTER — Other Ambulatory Visit: Payer: Self-pay | Admitting: Internal Medicine

## 2020-03-26 ENCOUNTER — Other Ambulatory Visit: Payer: Self-pay

## 2020-03-26 DIAGNOSIS — Z21 Asymptomatic human immunodeficiency virus [HIV] infection status: Secondary | ICD-10-CM

## 2020-03-27 LAB — T-HELPER CELL (CD4) - (RCID CLINIC ONLY)
CD4 % Helper T Cell: 40 % (ref 33–65)
CD4 T Cell Abs: 678 /uL (ref 400–1790)

## 2020-03-29 LAB — HIV-1 RNA QUANT-NO REFLEX-BLD
HIV 1 RNA Quant: <20 copies/mL
HIV-1 RNA Quant, Log: <1.3 Log copies/mL

## 2020-04-01 ENCOUNTER — Encounter: Payer: Self-pay | Admitting: Internal Medicine

## 2020-04-06 ENCOUNTER — Encounter: Payer: Self-pay | Admitting: Internal Medicine

## 2020-04-12 ENCOUNTER — Emergency Department (HOSPITAL_COMMUNITY): Payer: Self-pay

## 2020-04-12 ENCOUNTER — Encounter (HOSPITAL_COMMUNITY): Payer: Self-pay | Admitting: Emergency Medicine

## 2020-04-12 ENCOUNTER — Emergency Department (HOSPITAL_COMMUNITY)
Admission: EM | Admit: 2020-04-12 | Discharge: 2020-04-12 | Disposition: A | Discharge status: Home / Self Care | Payer: Self-pay | Attending: Emergency Medicine | Admitting: Emergency Medicine

## 2020-04-12 ENCOUNTER — Other Ambulatory Visit: Payer: Self-pay

## 2020-04-12 DIAGNOSIS — Z79899 Other long term (current) drug therapy: Secondary | ICD-10-CM | POA: Insufficient documentation

## 2020-04-12 DIAGNOSIS — W010XXA Fall on same level from slipping, tripping and stumbling without subsequent striking against object, initial encounter: Secondary | ICD-10-CM | POA: Insufficient documentation

## 2020-04-12 DIAGNOSIS — Z91010 Allergy to peanuts: Secondary | ICD-10-CM | POA: Insufficient documentation

## 2020-04-12 DIAGNOSIS — M25461 Effusion, right knee: Secondary | ICD-10-CM | POA: Insufficient documentation

## 2020-04-12 DIAGNOSIS — Z21 Asymptomatic human immunodeficiency virus [HIV] infection status: Secondary | ICD-10-CM | POA: Insufficient documentation

## 2020-04-12 DIAGNOSIS — M25561 Pain in right knee: Secondary | ICD-10-CM | POA: Insufficient documentation

## 2020-04-12 DIAGNOSIS — Y999 Unspecified external cause status: Secondary | ICD-10-CM | POA: Insufficient documentation

## 2020-04-12 DIAGNOSIS — Y9301 Activity, walking, marching and hiking: Secondary | ICD-10-CM | POA: Insufficient documentation

## 2020-04-12 DIAGNOSIS — Y929 Unspecified place or not applicable: Secondary | ICD-10-CM | POA: Insufficient documentation

## 2020-04-12 MED ORDER — IBUPROFEN 600 MG PO TABS: 600.0000 mg | ORAL_TABLET | Freq: Four times a day (QID) | ORAL | 0 refills | Status: AC | PRN

## 2020-04-12 MED ORDER — IBUPROFEN 800 MG PO TABS
800.0000 mg | ORAL_TABLET | Freq: Once | ORAL | Status: AC
  Administered 2020-04-12: 800 mg via ORAL
  Filled 2020-04-12: qty 1

## 2020-04-12 NOTE — Discharge Instructions (Signed)
There is no fracture in your knee.  You can buy an over the counter elastic knee brace for comfort and support for the next 7-10 days to use.  You can also take motrin 600 mg every 6 hours for pain.  Try to rest your leg at home, keep it elevated, and use ice packs on your knee for 10 minutes at a time.  You are allowed to bear weight on your knee.

## 2020-04-12 NOTE — ED Triage Notes (Signed)
Pt reports fell walking the dog yesterday. Having right knee pains.

## 2020-04-12 NOTE — ED Provider Notes (Signed)
[facility]-EMERGENCY DEPT Provider Note  CSN: [id number] Arrival date & time: 04/12/20  1312     History CC: Fall on right knee  Bryan Booth is a 24 y.o. adult presented to emergency department with pain in his right knee after a fall yesterday.  Patient reports he was walking his dog and got tangled up and fell directly onto his right knee.  He has been having pain in the knee since then.  He has been able to limp.  There is no numbness or weakness in his foot.  No other injuries from the fall.  HPI     History reviewed. No pertinent past medical history.  Patient Active Problem List   Diagnosis Date Noted  . Need for vaccination for bacterial disease 12/25/2019  . Human immunodeficiency virus (HIV) infection (HCC) 11/09/2019  . Transaminitis 11/09/2019  . Acute pericarditis 11/06/2019    Past Surgical History:  Procedure Laterality Date  . non distended testis         Family History  Problem Relation Age of Onset  . Mitral valve prolapse Mother   . CAD Mother   . Hypertension Mother     Social History   Tobacco Use  . Smoking status: Never Smoker  . Smokeless tobacco: Never Used  Substance Use Topics  . Alcohol use: Yes  . Drug use: Not Currently    Home Medications Prior to Admission medications   Medication Sig Start Date End Date Taking? Authorizing Provider  bictegravir-emtricitabine-tenofovir AF (BIKTARVY) 50-200-25 MG TABS tablet Take 1 tablet by mouth daily. 11/22/19   Comer, Belia Heman, MD  ibuprofen (ADVIL) 600 MG tablet Take 1 tablet (600 mg total) by mouth every 6 (six) hours as needed for up to 30 doses. 04/12/20   Terald Sleeper, MD    Allergies    Peanut-containing drug products and Penicillins  Review of Systems   Review of Systems  Constitutional: Negative for chills and fever.  Respiratory: Negative for cough and shortness of breath.   Cardiovascular: Negative for chest pain and palpitations.    Musculoskeletal: Positive for arthralgias and myalgias.  Skin: Negative for rash and wound.  Neurological: Negative for weakness and numbness.  Psychiatric/Behavioral: Negative for agitation and confusion.  All other systems reviewed and are negative.   Physical Exam Updated Vital Signs BP (!) 147/91   Pulse 83   Temp 98.6 F (37 C)   Resp 18   SpO2 97%   Physical Exam Vitals and nursing note reviewed.  Constitutional:      Appearance: He is well-developed.  HENT:     Head: Normocephalic and atraumatic.  Eyes:     Conjunctiva/sclera: Conjunctivae normal.  Cardiovascular:     Rate and Rhythm: Normal rate and regular rhythm.     Pulses: Normal pulses.  Pulmonary:     Effort: Pulmonary effort is normal. No respiratory distress.  Musculoskeletal:     Cervical back: Neck supple.     Comments: Right leg No isolated tenderness of the patella No isolated tenderness of the fibular head Patient able to flex knee to 90 degrees Patient able to bear weight immediately after incident and here in the ED. No evidence of posterior knee dislocation. Small medial patellar effusion Distal extremity is neurovascularly intact. Left leg without focal findings   Skin:    General: Skin is warm and dry.  Neurological:     General: No focal deficit present.     Mental Status: He is  alert.     Sensory: No sensory deficit.  Psychiatric:        Mood and Affect: Mood normal.     ED Results / Procedures / Treatments   Labs (all labs ordered are listed, but only abnormal results are displayed) Labs Reviewed - No data to display  EKG None  Radiology DG Knee Complete 4 Views Right  Result Date: 04/12/2020 CLINICAL DATA:  Fall with right knee pain.  Initial encounter. EXAM: RIGHT KNEE - COMPLETE 4+ VIEW COMPARISON:  None. FINDINGS: No evidence of fracture, dislocation, or joint effusion. No evidence of arthropathy or other focal bone abnormality. Soft tissues are unremarkable. IMPRESSION:  Negative. Electronically Signed   By: Aletta Edouard M.D.   On: 04/12/2020 14:25    Procedures Procedures (including critical care time)  Medications Ordered in ED Medications  ibuprofen (ADVIL) tablet 800 mg (800 mg Oral Given 04/12/20 1440)    ED Course  I have reviewed the triage vital signs and the nursing notes.  Pertinent labs & imaging results that were available during my care of the patient were reviewed by me and considered in my medical decision making (see chart for details).  24 yo M here with mechnical fall onto right knee yesterday. [place] knee criteria negative - xrays ordered from triage Small effusion, no joint instability Will give motrin, f/u on xray read, anticipate discharge, advised buying OTC elastic knee brace and WBAT.  If he is still having pain in 2 weeks he can see an orthopedist for evaluation for possible ligament injury.   Final Clinical Impression(s) / ED Diagnoses Final diagnoses:  Acute pain of right knee    Rx / DC Orders ED Discharge Orders         Ordered    ibuprofen (ADVIL) 600 MG tablet  Every 6 hours PRN     04/12/20 1432           Wyvonnia Dusky, MD 04/12/20 1723

## 2020-06-24 ENCOUNTER — Telehealth: Payer: Self-pay | Admitting: *Deleted

## 2020-06-24 NOTE — Telephone Encounter (Signed)
Referral/Order received on 12/25/2019 by Dr. Luciana Axe to evaluate patient for [facility]).  Patient was evaluated on  for [facility]. Patient was consented to care at this time.   Frequency / Duration of [facility] visits: Effective:06/24/20 47mo4 3 PRN's for complications with disease process/progression, medication changes or concerns   [facility] will assess for learning needs related to diagnosis and treatment regimen, provide education as needed, fill pill box if needed, address any barriers which may be preventing medication compliance, and communicating with care team including physician and case manager.   Individualized Plan Of Care 06/24/2020 to 09/22/2020  a. Type of service(s) and care to be delivered: RN Case Management  b. Frequency and duration of service: Effective 15mo4 3 prns for  complications with disease process/progression, medication changes or concerns . Visits/Contact may be conducted telephonically or in person to best suit the patient.  c. Activity restrictions: Pt may be up as tolerated and can safely ambulate without the need for a assistive device   d. Safety Measures: Standard Precautions/Infection Control   e. Service Objectives and Goals: Service Objectives are to assist the pt with HIV medication regimen adherence and staying in care with the [facility] by identifying barriers to care. RN will address the barriers that are identified by the patient. Patient stated goal is to receive support with his new HIV diagnosis. Most of the communication is via text but it appears to be helpful and supportive.   f. Equipment required: No additional equipment needs at this time   g. Functional Limitations: None noted   h. Rehabilitation potential: Good  i. Diet and Nutritional Needs: Regular Diet   j. Medications and treatments: Medications have been reconciled and reviewed and are a part of EPIC electronic file   k. Specific therapies if  needed: RN   l. Pertinent diagnoses: New HIV diagnosis  m. Expected outcome: Guarded

## 2020-06-25 NOTE — Telephone Encounter (Signed)
I agree with the POC as outlined.  

## 2020-06-26 ENCOUNTER — Other Ambulatory Visit: Payer: Self-pay | Admitting: Internal Medicine

## 2020-06-26 DIAGNOSIS — B20 Human immunodeficiency virus [HIV] disease: Secondary | ICD-10-CM

## 2020-07-07 ENCOUNTER — Telehealth: Payer: Self-pay | Admitting: *Deleted

## 2020-07-07 NOTE — Telephone Encounter (Addendum)
Contacted Arya and he has relocated back to [place] to care for his family. Offered to mail medications to him if needed. Currently medications are being mailed but Savir has not been able to re-establish care.   [facility] is local for the patient and also accepts [employer]. Called their office and they need a referral to be sent to [phone]. After receiving the referral their office will contact Jill Alexanders

## 2020-07-23 ENCOUNTER — Telehealth: Payer: Self-pay | Admitting: *Deleted

## 2020-07-23 NOTE — Telephone Encounter (Signed)
The intent of this communication is to inform the Health Care Team that this patient will be discharged from [facility]). Bryan Booth has relocated back to [place] to care for his family. Offered to mail medications to him if needed. Currently medications are being mailed but Bryan Booth has not been able to re-establish care. [facility] is local for the patient and also accepts [employer]. Referral has been submitted along with the last 2 [facility] visit notes and proof of positivity. Information was sent to [phone].  Effective 07/23/20 patient will be removed from Active patient list.

## 2020-07-24 ENCOUNTER — Other Ambulatory Visit: Payer: Self-pay | Admitting: Internal Medicine

## 2020-07-24 DIAGNOSIS — B20 Human immunodeficiency virus [HIV] disease: Secondary | ICD-10-CM

## 2020-08-04 ENCOUNTER — Other Ambulatory Visit: Payer: Self-pay

## 2020-08-04 DIAGNOSIS — B20 Human immunodeficiency virus [HIV] disease: Secondary | ICD-10-CM

## 2020-08-04 MED ORDER — BIKTARVY 50-200-25 MG PO TABS: 1.0000 | ORAL_TABLET | Freq: Every day | ORAL | 0 refills | Status: AC

## 2020-08-29 ENCOUNTER — Other Ambulatory Visit: Payer: Self-pay | Admitting: Internal Medicine

## 2020-08-29 DIAGNOSIS — B20 Human immunodeficiency virus [HIV] disease: Secondary | ICD-10-CM

## 2021-03-23 IMAGING — CR DG KNEE COMPLETE 4+V*R*
4 series · 4 of 4 positions shown · non-contrast
Comparison: None.

CLINICAL DATA: Fall with right knee pain.  Initial encounter.

EXAM:
RIGHT KNEE - COMPLETE 4+ VIEW

[t knee obl right (1 of 3)]
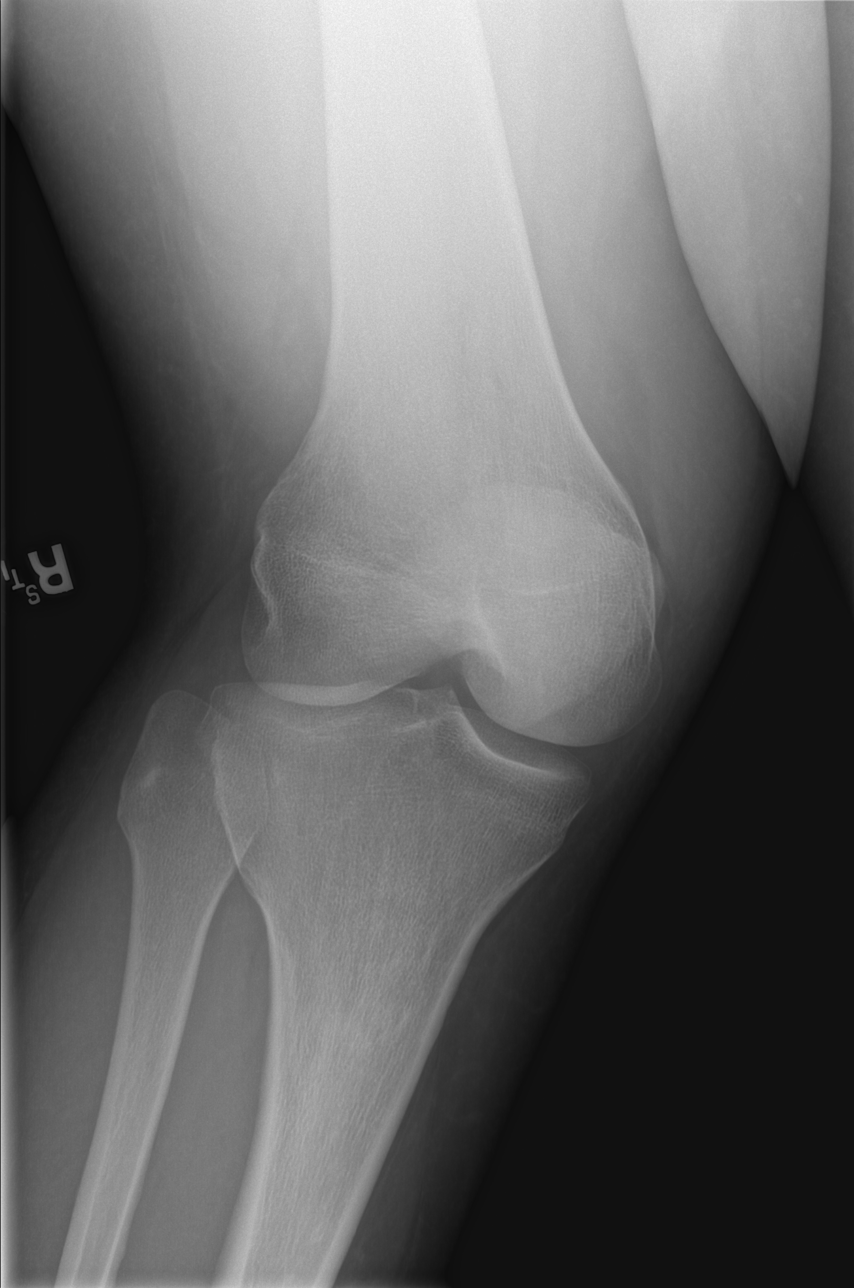

[t knee obl right (2 of 3)]
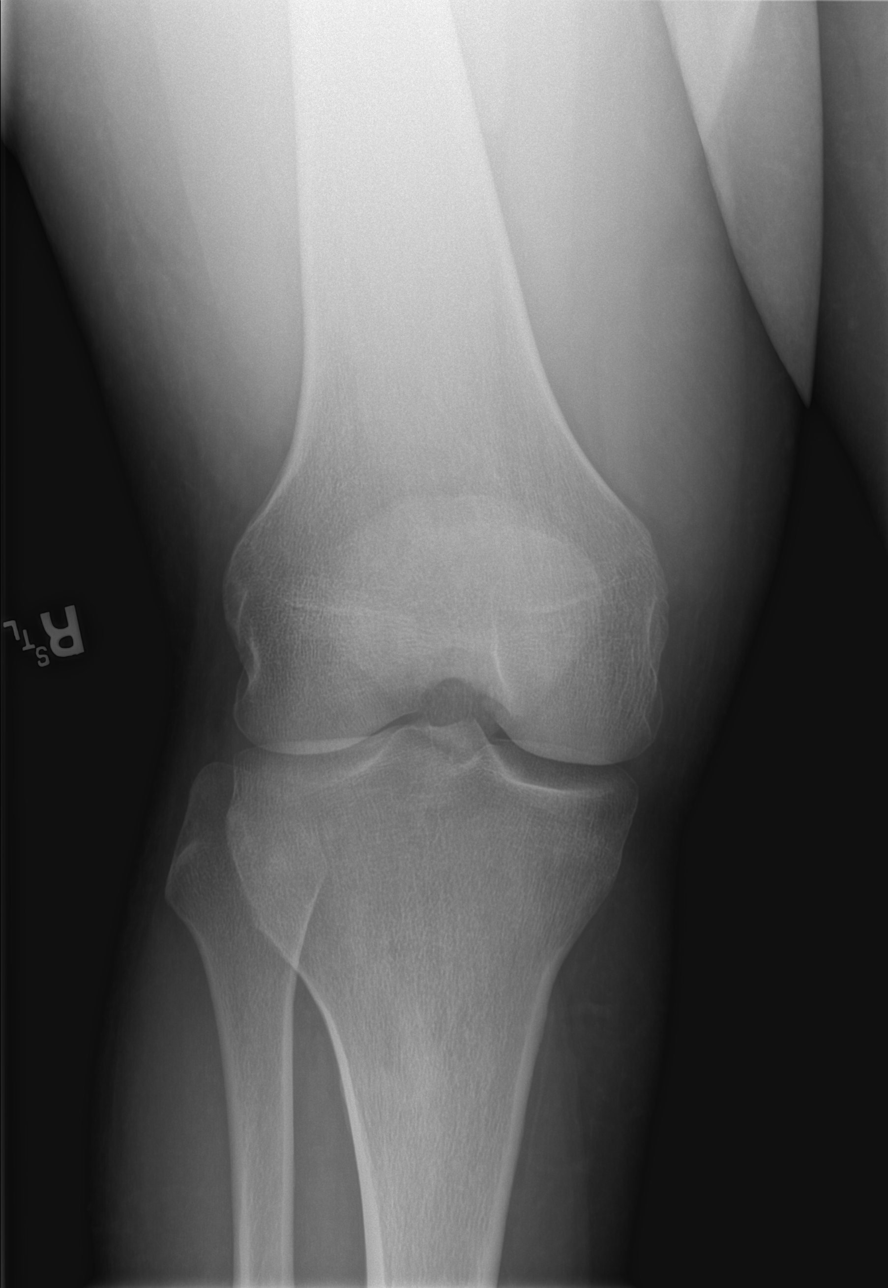

[t knee obl right (3 of 3)]
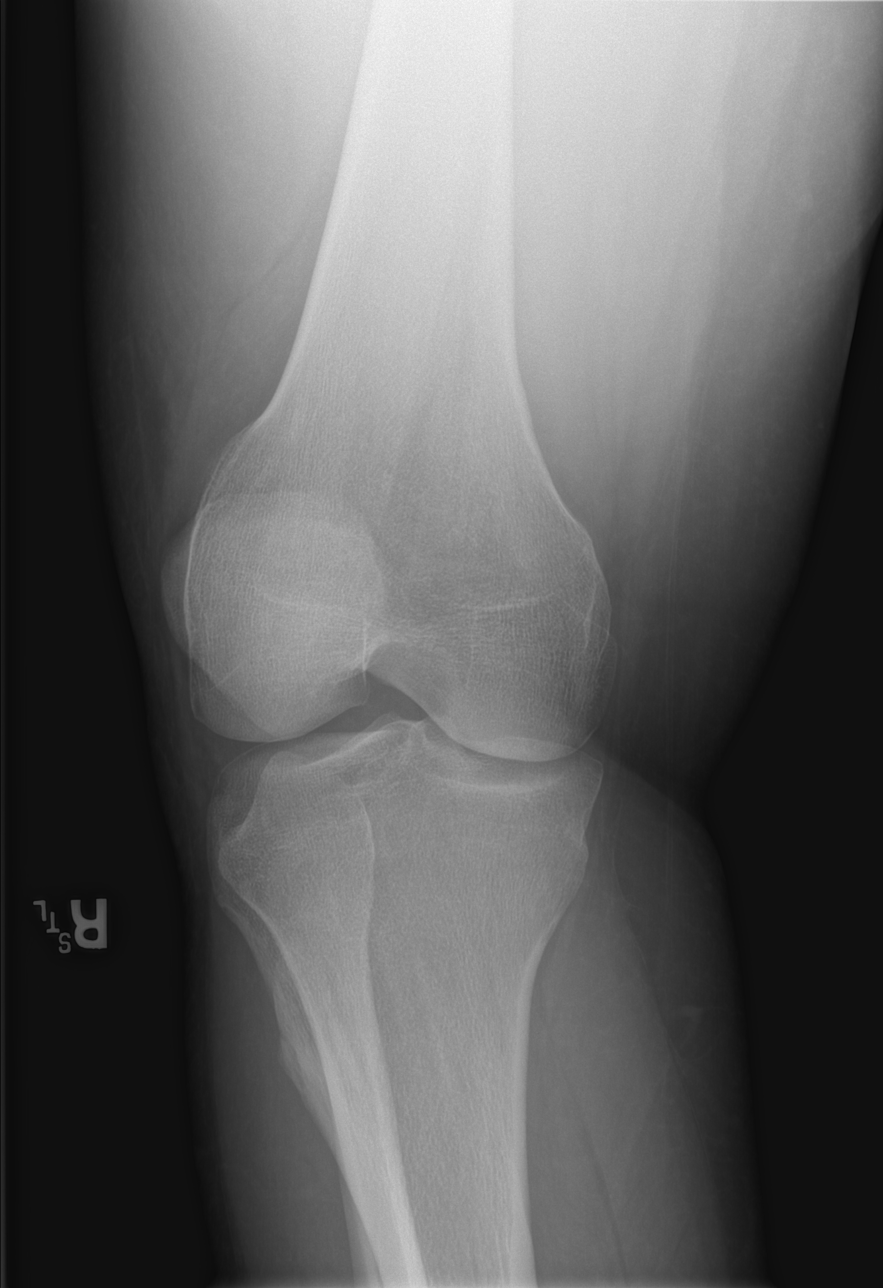

[t knee lat right]
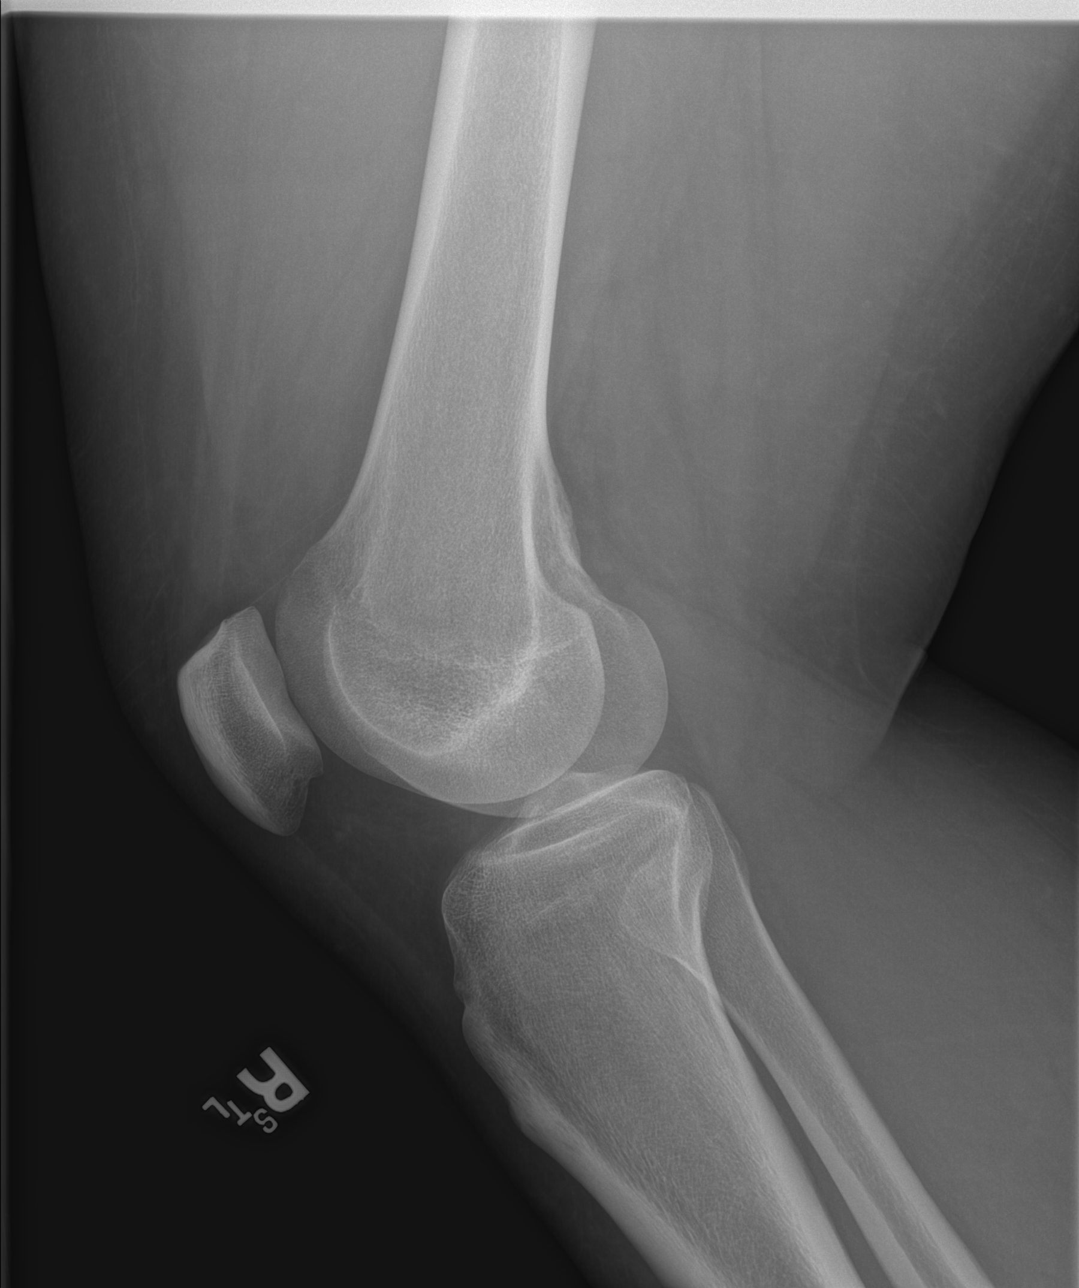

[4 of 4 positions shown; findings below may reference images not displayed]

FINDINGS: No evidence of fracture, dislocation, or joint effusion. No evidence
of arthropathy or other focal bone abnormality. Soft tissues are
unremarkable.
IMPRESSION: Negative.
